# Patient Record
Sex: Female | Born: 1940 | Race: White | Hispanic: No | Marital: Married | Smoking: Never smoker
Health system: Southern US, Community
[De-identification: ages and names within clinical notes are randomized; demographics above are authoritative.]

## PROBLEM LIST (undated history)

## (undated) DIAGNOSIS — I10 Essential (primary) hypertension: Secondary | ICD-10-CM

## (undated) DIAGNOSIS — E785 Hyperlipidemia, unspecified: Secondary | ICD-10-CM

## (undated) DIAGNOSIS — E559 Vitamin D deficiency, unspecified: Secondary | ICD-10-CM

## (undated) DIAGNOSIS — E1140 Type 2 diabetes mellitus with diabetic neuropathy, unspecified: Secondary | ICD-10-CM

## (undated) DIAGNOSIS — M810 Age-related osteoporosis without current pathological fracture: Secondary | ICD-10-CM

## (undated) DIAGNOSIS — E119 Type 2 diabetes mellitus without complications: Secondary | ICD-10-CM

## (undated) DIAGNOSIS — E538 Deficiency of other specified B group vitamins: Secondary | ICD-10-CM

## (undated) HISTORY — PX: CATARACT EXTRACTION: SUR2

## (undated) HISTORY — DX: Type 2 diabetes mellitus without complications: E11.9

## (undated) HISTORY — PX: TONSILLECTOMY AND ADENOIDECTOMY: SHX28

## (undated) HISTORY — DX: Vitamin D deficiency, unspecified: E55.9

## (undated) HISTORY — DX: Deficiency of other specified B group vitamins: E53.8

## (undated) HISTORY — DX: Essential (primary) hypertension: I10

## (undated) HISTORY — DX: Hyperlipidemia, unspecified: E78.5

## (undated) HISTORY — PX: BLADDER SURGERY: SHX569

## (undated) HISTORY — DX: Type 2 diabetes mellitus with diabetic neuropathy, unspecified: E11.40

## (undated) HISTORY — DX: Age-related osteoporosis without current pathological fracture: M81.0

## (undated) HISTORY — PX: BREAST LUMPECTOMY: SHX2

## (undated) HISTORY — PX: FOOT SURGERY: SHX648

---

## 1999-04-11 ENCOUNTER — Ambulatory Visit (HOSPITAL_COMMUNITY): Admission: RE | Admit: 1999-04-11 | Discharge: 1999-04-11 | Payer: Self-pay | Admitting: Neurology

## 1999-04-11 ENCOUNTER — Encounter: Payer: Self-pay | Admitting: Neurology

## 2000-02-16 ENCOUNTER — Encounter: Admission: RE | Admit: 2000-02-16 | Discharge: 2000-02-16 | Payer: Self-pay | Admitting: Emergency Medicine

## 2000-02-16 ENCOUNTER — Encounter: Payer: Self-pay | Admitting: Emergency Medicine

## 2001-02-16 ENCOUNTER — Encounter: Payer: Self-pay | Admitting: Emergency Medicine

## 2001-02-16 ENCOUNTER — Encounter: Admission: RE | Admit: 2001-02-16 | Discharge: 2001-02-16 | Payer: Self-pay | Admitting: Emergency Medicine

## 2001-02-27 ENCOUNTER — Encounter: Payer: Self-pay | Admitting: Emergency Medicine

## 2001-02-27 ENCOUNTER — Encounter: Admission: RE | Admit: 2001-02-27 | Discharge: 2001-02-27 | Payer: Self-pay | Admitting: Emergency Medicine

## 2001-03-20 ENCOUNTER — Ambulatory Visit (HOSPITAL_COMMUNITY): Admission: RE | Admit: 2001-03-20 | Discharge: 2001-03-20 | Payer: Self-pay | Admitting: Gastroenterology

## 2002-03-06 ENCOUNTER — Encounter: Payer: Self-pay | Admitting: Emergency Medicine

## 2002-03-06 ENCOUNTER — Encounter: Admission: RE | Admit: 2002-03-06 | Discharge: 2002-03-06 | Payer: Self-pay | Admitting: Emergency Medicine

## 2003-03-12 ENCOUNTER — Encounter: Admission: RE | Admit: 2003-03-12 | Discharge: 2003-03-12 | Payer: Self-pay | Admitting: Emergency Medicine

## 2003-03-12 ENCOUNTER — Encounter: Payer: Self-pay | Admitting: Emergency Medicine

## 2004-01-28 ENCOUNTER — Other Ambulatory Visit: Admission: RE | Admit: 2004-01-28 | Discharge: 2004-01-28 | Payer: Self-pay | Admitting: Emergency Medicine

## 2004-03-31 ENCOUNTER — Encounter: Admission: RE | Admit: 2004-03-31 | Discharge: 2004-03-31 | Payer: Self-pay | Admitting: Emergency Medicine

## 2005-04-13 ENCOUNTER — Encounter: Admission: RE | Admit: 2005-04-13 | Discharge: 2005-04-13 | Payer: Self-pay | Admitting: Emergency Medicine

## 2006-04-12 ENCOUNTER — Other Ambulatory Visit: Admission: RE | Admit: 2006-04-12 | Discharge: 2006-04-12 | Payer: Self-pay | Admitting: Emergency Medicine

## 2006-04-18 ENCOUNTER — Encounter: Admission: RE | Admit: 2006-04-18 | Discharge: 2006-04-18 | Payer: Self-pay | Admitting: Emergency Medicine

## 2007-05-02 ENCOUNTER — Encounter: Admission: RE | Admit: 2007-05-02 | Discharge: 2007-05-02 | Payer: Self-pay | Admitting: Emergency Medicine

## 2008-05-06 ENCOUNTER — Encounter: Admission: RE | Admit: 2008-05-06 | Discharge: 2008-05-06 | Payer: Self-pay

## 2008-05-21 ENCOUNTER — Encounter: Admission: RE | Admit: 2008-05-21 | Discharge: 2008-05-21 | Payer: Self-pay

## 2009-05-27 ENCOUNTER — Encounter: Admission: RE | Admit: 2009-05-27 | Discharge: 2009-05-27 | Payer: Self-pay

## 2010-06-01 ENCOUNTER — Encounter: Admission: RE | Admit: 2010-06-01 | Discharge: 2010-06-01 | Payer: Self-pay | Admitting: Family Medicine

## 2010-08-04 ENCOUNTER — Encounter: Admission: RE | Admit: 2010-08-04 | Discharge: 2010-08-04 | Payer: Self-pay | Source: Home / Self Care

## 2010-11-26 NOTE — Procedures (Signed)
[place]. [facility]  Patient:    Jodi Nelson, Jodi Nelson Visit Number: [id number] MRN: [id number]          Service Type: END Location: ENDO Attending Physician:  Orland Mustard Dictated by:   Llana Aliment. Randa Evens, M.D. Proc. Date: 03/20/01 Admit Date:  03/20/2001   CC:         Oley Balm. Georgina Pillion, M.D.   Procedure Report  PROCEDURE:  Colonoscopy.  MEDICATIONS:  Fentanyl 80 mcg, Versed 9 mg IV.  SCOPE:  Pediatric Olympus video colonoscope.  INDICATION:  Family history of colon cancer.  DESCRIPTION OF PROCEDURE:  The procedure was explained to the patient and consent obtained.  With the patient in the left lateral decubitus position, the Olympus video colonoscope was inserted and advanced under direct visualization.  Prep was excellent.  We advanced to the cecum without difficulty.  The ileocecal valve and the appendiceal orifice were seen.  The scope was withdrawn, and the cecum, ascending colon, hepatic flexure, transverse colon, splenic flexure, descending, and sigmoid colon were seen well upon removal.  No polyps or other lesions were seen.  The scope was withdrawn.  The patient tolerated the procedure well, was maintained on low-flow oxygen and pulse oximeter throughout the procedure.  ASSESSMENT:  No colon polyps seen in this woman.  High risk for colon cancer.  PLAN:  Recommend repeating in five years.Dictated by:   Llana Aliment. Randa Evens, M.D.  Attending Physician:  Orland Mustard DD:  03/20/01 TD:  03/20/01 Job: [id number]

## 2011-05-30 ENCOUNTER — Other Ambulatory Visit: Payer: Self-pay | Admitting: Family Medicine

## 2011-05-30 DIAGNOSIS — Z1231 Encounter for screening mammogram for malignant neoplasm of breast: Secondary | ICD-10-CM

## 2011-07-19 ENCOUNTER — Ambulatory Visit
Admission: RE | Admit: 2011-07-19 | Discharge: 2011-07-19 | Disposition: A | Discharge status: Home / Self Care | Payer: MEDICARE | Source: Ambulatory Visit | Attending: Family Medicine | Admitting: Family Medicine

## 2011-07-19 DIAGNOSIS — Z1231 Encounter for screening mammogram for malignant neoplasm of breast: Secondary | ICD-10-CM

## 2011-07-26 ENCOUNTER — Other Ambulatory Visit: Payer: Self-pay | Admitting: Family Medicine

## 2011-07-26 DIAGNOSIS — R928 Other abnormal and inconclusive findings on diagnostic imaging of breast: Secondary | ICD-10-CM

## 2011-08-02 ENCOUNTER — Ambulatory Visit
Admission: RE | Admit: 2011-08-02 | Discharge: 2011-08-02 | Disposition: A | Discharge status: Home / Self Care | Payer: MEDICARE | Source: Ambulatory Visit | Attending: Family Medicine | Admitting: Family Medicine

## 2011-08-02 ENCOUNTER — Other Ambulatory Visit: Payer: Self-pay | Admitting: Family Medicine

## 2011-08-02 DIAGNOSIS — R928 Other abnormal and inconclusive findings on diagnostic imaging of breast: Secondary | ICD-10-CM

## 2011-08-10 ENCOUNTER — Ambulatory Visit
Admission: RE | Admit: 2011-08-10 | Discharge: 2011-08-10 | Disposition: A | Discharge status: Home / Self Care | Payer: Medicare Other | Source: Ambulatory Visit | Attending: Family Medicine | Admitting: Family Medicine

## 2011-08-10 ENCOUNTER — Other Ambulatory Visit: Payer: Self-pay | Admitting: Family Medicine

## 2011-08-10 DIAGNOSIS — R928 Other abnormal and inconclusive findings on diagnostic imaging of breast: Secondary | ICD-10-CM

## 2011-08-10 DIAGNOSIS — R921 Mammographic calcification found on diagnostic imaging of breast: Secondary | ICD-10-CM

## 2011-08-10 HISTORY — PX: BREAST BIOPSY: SHX20

## 2012-02-22 ENCOUNTER — Other Ambulatory Visit: Payer: Self-pay | Admitting: Family Medicine

## 2012-02-22 DIAGNOSIS — N6019 Diffuse cystic mastopathy of unspecified breast: Secondary | ICD-10-CM

## 2012-03-14 ENCOUNTER — Ambulatory Visit
Admission: RE | Admit: 2012-03-14 | Discharge: 2012-03-14 | Disposition: A | Discharge status: Home / Self Care | Payer: Medicare Other | Source: Ambulatory Visit | Attending: Family Medicine | Admitting: Family Medicine

## 2012-03-14 DIAGNOSIS — N6019 Diffuse cystic mastopathy of unspecified breast: Secondary | ICD-10-CM

## 2012-07-31 ENCOUNTER — Other Ambulatory Visit: Payer: Self-pay | Admitting: Family Medicine

## 2012-07-31 DIAGNOSIS — Z1231 Encounter for screening mammogram for malignant neoplasm of breast: Secondary | ICD-10-CM

## 2012-07-31 DIAGNOSIS — M810 Age-related osteoporosis without current pathological fracture: Secondary | ICD-10-CM

## 2012-08-14 ENCOUNTER — Ambulatory Visit: Payer: Medicare Other

## 2012-08-28 ENCOUNTER — Ambulatory Visit
Admission: RE | Admit: 2012-08-28 | Discharge: 2012-08-28 | Disposition: A | Discharge status: Home / Self Care | Payer: Medicare Other | Source: Ambulatory Visit | Attending: Family Medicine | Admitting: Family Medicine

## 2012-08-28 DIAGNOSIS — M810 Age-related osteoporosis without current pathological fracture: Secondary | ICD-10-CM

## 2012-08-28 DIAGNOSIS — Z1231 Encounter for screening mammogram for malignant neoplasm of breast: Secondary | ICD-10-CM

## 2013-08-19 ENCOUNTER — Other Ambulatory Visit: Payer: Self-pay

## 2013-08-19 DIAGNOSIS — Z1231 Encounter for screening mammogram for malignant neoplasm of breast: Secondary | ICD-10-CM

## 2013-09-10 ENCOUNTER — Ambulatory Visit
Admission: RE | Admit: 2013-09-10 | Discharge: 2013-09-10 | Disposition: A | Discharge status: Home / Self Care | Payer: Medicare Other | Source: Ambulatory Visit

## 2013-09-10 DIAGNOSIS — Z1231 Encounter for screening mammogram for malignant neoplasm of breast: Secondary | ICD-10-CM

## 2014-09-25 ENCOUNTER — Other Ambulatory Visit: Payer: Self-pay | Admitting: Internal Medicine

## 2014-09-25 DIAGNOSIS — Z1231 Encounter for screening mammogram for malignant neoplasm of breast: Secondary | ICD-10-CM

## 2014-09-25 DIAGNOSIS — M810 Age-related osteoporosis without current pathological fracture: Secondary | ICD-10-CM

## 2014-09-26 ENCOUNTER — Other Ambulatory Visit: Payer: Self-pay | Admitting: Family Medicine

## 2014-09-26 DIAGNOSIS — Z1231 Encounter for screening mammogram for malignant neoplasm of breast: Secondary | ICD-10-CM

## 2014-09-26 DIAGNOSIS — M810 Age-related osteoporosis without current pathological fracture: Secondary | ICD-10-CM

## 2014-10-08 ENCOUNTER — Ambulatory Visit
Admission: RE | Admit: 2014-10-08 | Discharge: 2014-10-08 | Disposition: A | Discharge status: Home / Self Care | Payer: Medicare Other | Source: Ambulatory Visit | Attending: Internal Medicine | Admitting: Internal Medicine

## 2014-10-08 DIAGNOSIS — Z1231 Encounter for screening mammogram for malignant neoplasm of breast: Secondary | ICD-10-CM

## 2014-10-08 DIAGNOSIS — M810 Age-related osteoporosis without current pathological fracture: Secondary | ICD-10-CM

## 2015-09-18 ENCOUNTER — Other Ambulatory Visit: Payer: Self-pay

## 2015-09-18 DIAGNOSIS — Z1231 Encounter for screening mammogram for malignant neoplasm of breast: Secondary | ICD-10-CM

## 2015-10-13 ENCOUNTER — Ambulatory Visit
Admission: RE | Admit: 2015-10-13 | Discharge: 2015-10-13 | Disposition: A | Discharge status: Home / Self Care | Payer: Medicare Other | Source: Ambulatory Visit

## 2015-10-13 DIAGNOSIS — Z1231 Encounter for screening mammogram for malignant neoplasm of breast: Secondary | ICD-10-CM

## 2016-07-19 DIAGNOSIS — H47233 Glaucomatous optic atrophy, bilateral: Secondary | ICD-10-CM | POA: Diagnosis not present

## 2016-07-19 DIAGNOSIS — H401132 Primary open-angle glaucoma, bilateral, moderate stage: Secondary | ICD-10-CM | POA: Diagnosis not present

## 2016-07-19 DIAGNOSIS — H3589 Other specified retinal disorders: Secondary | ICD-10-CM | POA: Diagnosis not present

## 2016-07-19 DIAGNOSIS — I10 Essential (primary) hypertension: Secondary | ICD-10-CM | POA: Diagnosis not present

## 2016-07-26 DIAGNOSIS — G603 Idiopathic progressive neuropathy: Secondary | ICD-10-CM | POA: Diagnosis not present

## 2016-07-26 DIAGNOSIS — R269 Unspecified abnormalities of gait and mobility: Secondary | ICD-10-CM | POA: Diagnosis not present

## 2016-07-26 DIAGNOSIS — E0842 Diabetes mellitus due to underlying condition with diabetic polyneuropathy: Secondary | ICD-10-CM | POA: Diagnosis not present

## 2016-07-26 DIAGNOSIS — M6281 Muscle weakness (generalized): Secondary | ICD-10-CM | POA: Diagnosis not present

## 2016-08-02 DIAGNOSIS — E538 Deficiency of other specified B group vitamins: Secondary | ICD-10-CM | POA: Diagnosis not present

## 2016-08-02 DIAGNOSIS — N390 Urinary tract infection, site not specified: Secondary | ICD-10-CM | POA: Diagnosis not present

## 2016-08-02 DIAGNOSIS — E0842 Diabetes mellitus due to underlying condition with diabetic polyneuropathy: Secondary | ICD-10-CM | POA: Diagnosis not present

## 2016-08-02 DIAGNOSIS — G603 Idiopathic progressive neuropathy: Secondary | ICD-10-CM | POA: Diagnosis not present

## 2016-08-02 DIAGNOSIS — M6281 Muscle weakness (generalized): Secondary | ICD-10-CM | POA: Diagnosis not present

## 2016-08-02 DIAGNOSIS — R269 Unspecified abnormalities of gait and mobility: Secondary | ICD-10-CM | POA: Diagnosis not present

## 2016-08-03 DIAGNOSIS — M6281 Muscle weakness (generalized): Secondary | ICD-10-CM | POA: Diagnosis not present

## 2016-08-03 DIAGNOSIS — E0842 Diabetes mellitus due to underlying condition with diabetic polyneuropathy: Secondary | ICD-10-CM | POA: Diagnosis not present

## 2016-08-03 DIAGNOSIS — R269 Unspecified abnormalities of gait and mobility: Secondary | ICD-10-CM | POA: Diagnosis not present

## 2016-08-03 DIAGNOSIS — G603 Idiopathic progressive neuropathy: Secondary | ICD-10-CM | POA: Diagnosis not present

## 2016-08-09 DIAGNOSIS — R269 Unspecified abnormalities of gait and mobility: Secondary | ICD-10-CM | POA: Diagnosis not present

## 2016-08-09 DIAGNOSIS — G603 Idiopathic progressive neuropathy: Secondary | ICD-10-CM | POA: Diagnosis not present

## 2016-08-09 DIAGNOSIS — E0842 Diabetes mellitus due to underlying condition with diabetic polyneuropathy: Secondary | ICD-10-CM | POA: Diagnosis not present

## 2016-08-09 DIAGNOSIS — M6281 Muscle weakness (generalized): Secondary | ICD-10-CM | POA: Diagnosis not present

## 2016-08-10 DIAGNOSIS — R269 Unspecified abnormalities of gait and mobility: Secondary | ICD-10-CM | POA: Diagnosis not present

## 2016-08-10 DIAGNOSIS — M6281 Muscle weakness (generalized): Secondary | ICD-10-CM | POA: Diagnosis not present

## 2016-08-10 DIAGNOSIS — E0842 Diabetes mellitus due to underlying condition with diabetic polyneuropathy: Secondary | ICD-10-CM | POA: Diagnosis not present

## 2016-08-10 DIAGNOSIS — G603 Idiopathic progressive neuropathy: Secondary | ICD-10-CM | POA: Diagnosis not present

## 2016-08-16 DIAGNOSIS — G603 Idiopathic progressive neuropathy: Secondary | ICD-10-CM | POA: Diagnosis not present

## 2016-08-16 DIAGNOSIS — N904 Leukoplakia of vulva: Secondary | ICD-10-CM | POA: Diagnosis not present

## 2016-08-16 DIAGNOSIS — E0842 Diabetes mellitus due to underlying condition with diabetic polyneuropathy: Secondary | ICD-10-CM | POA: Diagnosis not present

## 2016-08-16 DIAGNOSIS — R269 Unspecified abnormalities of gait and mobility: Secondary | ICD-10-CM | POA: Diagnosis not present

## 2016-08-16 DIAGNOSIS — M6281 Muscle weakness (generalized): Secondary | ICD-10-CM | POA: Diagnosis not present

## 2016-08-17 DIAGNOSIS — G603 Idiopathic progressive neuropathy: Secondary | ICD-10-CM | POA: Diagnosis not present

## 2016-08-17 DIAGNOSIS — M6281 Muscle weakness (generalized): Secondary | ICD-10-CM | POA: Diagnosis not present

## 2016-08-17 DIAGNOSIS — R269 Unspecified abnormalities of gait and mobility: Secondary | ICD-10-CM | POA: Diagnosis not present

## 2016-08-17 DIAGNOSIS — E0842 Diabetes mellitus due to underlying condition with diabetic polyneuropathy: Secondary | ICD-10-CM | POA: Diagnosis not present

## 2016-08-23 DIAGNOSIS — E0842 Diabetes mellitus due to underlying condition with diabetic polyneuropathy: Secondary | ICD-10-CM | POA: Diagnosis not present

## 2016-08-23 DIAGNOSIS — G603 Idiopathic progressive neuropathy: Secondary | ICD-10-CM | POA: Diagnosis not present

## 2016-08-23 DIAGNOSIS — R269 Unspecified abnormalities of gait and mobility: Secondary | ICD-10-CM | POA: Diagnosis not present

## 2016-08-23 DIAGNOSIS — M6281 Muscle weakness (generalized): Secondary | ICD-10-CM | POA: Diagnosis not present

## 2016-08-24 DIAGNOSIS — G603 Idiopathic progressive neuropathy: Secondary | ICD-10-CM | POA: Diagnosis not present

## 2016-08-24 DIAGNOSIS — R269 Unspecified abnormalities of gait and mobility: Secondary | ICD-10-CM | POA: Diagnosis not present

## 2016-08-24 DIAGNOSIS — E0842 Diabetes mellitus due to underlying condition with diabetic polyneuropathy: Secondary | ICD-10-CM | POA: Diagnosis not present

## 2016-08-24 DIAGNOSIS — M6281 Muscle weakness (generalized): Secondary | ICD-10-CM | POA: Diagnosis not present

## 2016-08-31 DIAGNOSIS — R269 Unspecified abnormalities of gait and mobility: Secondary | ICD-10-CM | POA: Diagnosis not present

## 2016-08-31 DIAGNOSIS — G603 Idiopathic progressive neuropathy: Secondary | ICD-10-CM | POA: Diagnosis not present

## 2016-08-31 DIAGNOSIS — E0842 Diabetes mellitus due to underlying condition with diabetic polyneuropathy: Secondary | ICD-10-CM | POA: Diagnosis not present

## 2016-08-31 DIAGNOSIS — M6281 Muscle weakness (generalized): Secondary | ICD-10-CM | POA: Diagnosis not present

## 2016-08-31 DIAGNOSIS — E538 Deficiency of other specified B group vitamins: Secondary | ICD-10-CM | POA: Diagnosis not present

## 2016-09-06 DIAGNOSIS — E0842 Diabetes mellitus due to underlying condition with diabetic polyneuropathy: Secondary | ICD-10-CM | POA: Diagnosis not present

## 2016-09-06 DIAGNOSIS — G603 Idiopathic progressive neuropathy: Secondary | ICD-10-CM | POA: Diagnosis not present

## 2016-09-06 DIAGNOSIS — R269 Unspecified abnormalities of gait and mobility: Secondary | ICD-10-CM | POA: Diagnosis not present

## 2016-09-06 DIAGNOSIS — M6281 Muscle weakness (generalized): Secondary | ICD-10-CM | POA: Diagnosis not present

## 2016-09-16 ENCOUNTER — Other Ambulatory Visit: Payer: Self-pay | Admitting: Family Medicine

## 2016-09-16 DIAGNOSIS — Z1231 Encounter for screening mammogram for malignant neoplasm of breast: Secondary | ICD-10-CM

## 2016-10-04 DIAGNOSIS — E538 Deficiency of other specified B group vitamins: Secondary | ICD-10-CM | POA: Diagnosis not present

## 2016-10-18 DIAGNOSIS — I10 Essential (primary) hypertension: Secondary | ICD-10-CM | POA: Diagnosis not present

## 2016-10-18 DIAGNOSIS — H401132 Primary open-angle glaucoma, bilateral, moderate stage: Secondary | ICD-10-CM | POA: Diagnosis not present

## 2016-10-18 DIAGNOSIS — H47233 Glaucomatous optic atrophy, bilateral: Secondary | ICD-10-CM | POA: Diagnosis not present

## 2016-10-25 ENCOUNTER — Ambulatory Visit
Admission: RE | Admit: 2016-10-25 | Discharge: 2016-10-25 | Disposition: A | Discharge status: Home / Self Care | Payer: PPO | Source: Ambulatory Visit | Attending: Family Medicine | Admitting: Family Medicine

## 2016-10-25 DIAGNOSIS — Z1231 Encounter for screening mammogram for malignant neoplasm of breast: Secondary | ICD-10-CM | POA: Diagnosis not present

## 2016-11-01 DIAGNOSIS — E538 Deficiency of other specified B group vitamins: Secondary | ICD-10-CM | POA: Diagnosis not present

## 2016-11-01 DIAGNOSIS — M810 Age-related osteoporosis without current pathological fracture: Secondary | ICD-10-CM | POA: Diagnosis not present

## 2016-11-01 DIAGNOSIS — E559 Vitamin D deficiency, unspecified: Secondary | ICD-10-CM | POA: Diagnosis not present

## 2016-11-01 DIAGNOSIS — E782 Mixed hyperlipidemia: Secondary | ICD-10-CM | POA: Diagnosis not present

## 2016-11-01 DIAGNOSIS — E1136 Type 2 diabetes mellitus with diabetic cataract: Secondary | ICD-10-CM | POA: Diagnosis not present

## 2016-11-01 DIAGNOSIS — I10 Essential (primary) hypertension: Secondary | ICD-10-CM | POA: Diagnosis not present

## 2016-11-01 DIAGNOSIS — Z79899 Other long term (current) drug therapy: Secondary | ICD-10-CM | POA: Diagnosis not present

## 2016-11-01 DIAGNOSIS — E1143 Type 2 diabetes mellitus with diabetic autonomic (poly)neuropathy: Secondary | ICD-10-CM | POA: Diagnosis not present

## 2016-12-08 DIAGNOSIS — Z0000 Encounter for general adult medical examination without abnormal findings: Secondary | ICD-10-CM | POA: Diagnosis not present

## 2016-12-08 DIAGNOSIS — E538 Deficiency of other specified B group vitamins: Secondary | ICD-10-CM | POA: Diagnosis not present

## 2016-12-08 DIAGNOSIS — E785 Hyperlipidemia, unspecified: Secondary | ICD-10-CM | POA: Diagnosis not present

## 2016-12-20 ENCOUNTER — Other Ambulatory Visit: Payer: Self-pay | Admitting: Family Medicine

## 2016-12-20 DIAGNOSIS — M859 Disorder of bone density and structure, unspecified: Secondary | ICD-10-CM

## 2017-01-02 ENCOUNTER — Other Ambulatory Visit: Payer: Self-pay | Admitting: Family Medicine

## 2017-01-02 DIAGNOSIS — M810 Age-related osteoporosis without current pathological fracture: Secondary | ICD-10-CM

## 2017-01-04 ENCOUNTER — Ambulatory Visit
Admission: RE | Admit: 2017-01-04 | Discharge: 2017-01-04 | Disposition: A | Discharge status: Home / Self Care | Payer: PPO | Source: Ambulatory Visit | Attending: Family Medicine | Admitting: Family Medicine

## 2017-01-04 DIAGNOSIS — M810 Age-related osteoporosis without current pathological fracture: Secondary | ICD-10-CM

## 2017-01-04 DIAGNOSIS — Z780 Asymptomatic menopausal state: Secondary | ICD-10-CM | POA: Diagnosis not present

## 2017-01-04 DIAGNOSIS — M85852 Other specified disorders of bone density and structure, left thigh: Secondary | ICD-10-CM | POA: Diagnosis not present

## 2017-01-10 DIAGNOSIS — E538 Deficiency of other specified B group vitamins: Secondary | ICD-10-CM | POA: Diagnosis not present

## 2017-01-16 DIAGNOSIS — J0100 Acute maxillary sinusitis, unspecified: Secondary | ICD-10-CM | POA: Diagnosis not present

## 2017-01-24 DIAGNOSIS — I10 Essential (primary) hypertension: Secondary | ICD-10-CM | POA: Diagnosis not present

## 2017-01-24 DIAGNOSIS — H401132 Primary open-angle glaucoma, bilateral, moderate stage: Secondary | ICD-10-CM | POA: Diagnosis not present

## 2017-01-24 DIAGNOSIS — H47233 Glaucomatous optic atrophy, bilateral: Secondary | ICD-10-CM | POA: Diagnosis not present

## 2017-02-14 DIAGNOSIS — E538 Deficiency of other specified B group vitamins: Secondary | ICD-10-CM | POA: Diagnosis not present

## 2017-02-22 DIAGNOSIS — N904 Leukoplakia of vulva: Secondary | ICD-10-CM | POA: Diagnosis not present

## 2017-03-14 DIAGNOSIS — E538 Deficiency of other specified B group vitamins: Secondary | ICD-10-CM | POA: Diagnosis not present

## 2017-03-14 DIAGNOSIS — E1143 Type 2 diabetes mellitus with diabetic autonomic (poly)neuropathy: Secondary | ICD-10-CM | POA: Diagnosis not present

## 2017-03-14 DIAGNOSIS — E785 Hyperlipidemia, unspecified: Secondary | ICD-10-CM | POA: Diagnosis not present

## 2017-03-14 DIAGNOSIS — M810 Age-related osteoporosis without current pathological fracture: Secondary | ICD-10-CM | POA: Diagnosis not present

## 2017-03-14 DIAGNOSIS — E559 Vitamin D deficiency, unspecified: Secondary | ICD-10-CM | POA: Diagnosis not present

## 2017-03-14 DIAGNOSIS — I10 Essential (primary) hypertension: Secondary | ICD-10-CM | POA: Diagnosis not present

## 2017-03-22 DIAGNOSIS — E538 Deficiency of other specified B group vitamins: Secondary | ICD-10-CM | POA: Diagnosis not present

## 2017-04-11 DIAGNOSIS — H5200 Hypermetropia, unspecified eye: Secondary | ICD-10-CM | POA: Diagnosis not present

## 2017-04-11 DIAGNOSIS — E119 Type 2 diabetes mellitus without complications: Secondary | ICD-10-CM | POA: Diagnosis not present

## 2017-04-11 DIAGNOSIS — H52229 Regular astigmatism, unspecified eye: Secondary | ICD-10-CM | POA: Diagnosis not present

## 2017-04-11 DIAGNOSIS — H35372 Puckering of macula, left eye: Secondary | ICD-10-CM | POA: Diagnosis not present

## 2017-04-11 DIAGNOSIS — I10 Essential (primary) hypertension: Secondary | ICD-10-CM | POA: Diagnosis not present

## 2017-04-11 DIAGNOSIS — H47233 Glaucomatous optic atrophy, bilateral: Secondary | ICD-10-CM | POA: Diagnosis not present

## 2017-04-11 DIAGNOSIS — H524 Presbyopia: Secondary | ICD-10-CM | POA: Diagnosis not present

## 2017-04-11 DIAGNOSIS — H43313 Vitreous membranes and strands, bilateral: Secondary | ICD-10-CM | POA: Diagnosis not present

## 2017-04-11 DIAGNOSIS — H401132 Primary open-angle glaucoma, bilateral, moderate stage: Secondary | ICD-10-CM | POA: Diagnosis not present

## 2017-04-11 DIAGNOSIS — H43813 Vitreous degeneration, bilateral: Secondary | ICD-10-CM | POA: Diagnosis not present

## 2017-04-11 DIAGNOSIS — H25813 Combined forms of age-related cataract, bilateral: Secondary | ICD-10-CM | POA: Diagnosis not present

## 2017-04-19 DIAGNOSIS — E538 Deficiency of other specified B group vitamins: Secondary | ICD-10-CM | POA: Diagnosis not present

## 2017-04-19 DIAGNOSIS — Z23 Encounter for immunization: Secondary | ICD-10-CM | POA: Diagnosis not present

## 2017-05-23 DIAGNOSIS — E538 Deficiency of other specified B group vitamins: Secondary | ICD-10-CM | POA: Diagnosis not present

## 2017-06-21 DIAGNOSIS — E538 Deficiency of other specified B group vitamins: Secondary | ICD-10-CM | POA: Diagnosis not present

## 2017-07-12 DIAGNOSIS — E1143 Type 2 diabetes mellitus with diabetic autonomic (poly)neuropathy: Secondary | ICD-10-CM | POA: Diagnosis not present

## 2017-07-12 DIAGNOSIS — E785 Hyperlipidemia, unspecified: Secondary | ICD-10-CM | POA: Diagnosis not present

## 2017-07-12 DIAGNOSIS — I10 Essential (primary) hypertension: Secondary | ICD-10-CM | POA: Diagnosis not present

## 2017-07-12 DIAGNOSIS — Z1331 Encounter for screening for depression: Secondary | ICD-10-CM | POA: Diagnosis not present

## 2017-07-12 DIAGNOSIS — E538 Deficiency of other specified B group vitamins: Secondary | ICD-10-CM | POA: Diagnosis not present

## 2017-07-12 DIAGNOSIS — Z9181 History of falling: Secondary | ICD-10-CM | POA: Diagnosis not present

## 2017-07-12 DIAGNOSIS — E663 Overweight: Secondary | ICD-10-CM | POA: Diagnosis not present

## 2017-07-12 DIAGNOSIS — Z0000 Encounter for general adult medical examination without abnormal findings: Secondary | ICD-10-CM | POA: Diagnosis not present

## 2017-07-12 DIAGNOSIS — Z1211 Encounter for screening for malignant neoplasm of colon: Secondary | ICD-10-CM | POA: Diagnosis not present

## 2017-07-12 DIAGNOSIS — E559 Vitamin D deficiency, unspecified: Secondary | ICD-10-CM | POA: Diagnosis not present

## 2017-07-19 DIAGNOSIS — I10 Essential (primary) hypertension: Secondary | ICD-10-CM | POA: Diagnosis not present

## 2017-07-19 DIAGNOSIS — H47233 Glaucomatous optic atrophy, bilateral: Secondary | ICD-10-CM | POA: Diagnosis not present

## 2017-07-19 DIAGNOSIS — H401132 Primary open-angle glaucoma, bilateral, moderate stage: Secondary | ICD-10-CM | POA: Diagnosis not present

## 2017-07-26 DIAGNOSIS — M810 Age-related osteoporosis without current pathological fracture: Secondary | ICD-10-CM | POA: Diagnosis not present

## 2017-07-26 DIAGNOSIS — E559 Vitamin D deficiency, unspecified: Secondary | ICD-10-CM | POA: Diagnosis not present

## 2017-07-26 DIAGNOSIS — E785 Hyperlipidemia, unspecified: Secondary | ICD-10-CM | POA: Diagnosis not present

## 2017-07-26 DIAGNOSIS — E538 Deficiency of other specified B group vitamins: Secondary | ICD-10-CM | POA: Diagnosis not present

## 2017-07-26 DIAGNOSIS — R8290 Unspecified abnormal findings in urine: Secondary | ICD-10-CM | POA: Diagnosis not present

## 2017-07-26 DIAGNOSIS — E1143 Type 2 diabetes mellitus with diabetic autonomic (poly)neuropathy: Secondary | ICD-10-CM | POA: Diagnosis not present

## 2017-07-26 DIAGNOSIS — I10 Essential (primary) hypertension: Secondary | ICD-10-CM | POA: Diagnosis not present

## 2017-07-28 DIAGNOSIS — R69 Illness, unspecified: Secondary | ICD-10-CM | POA: Diagnosis not present

## 2017-08-25 DIAGNOSIS — R69 Illness, unspecified: Secondary | ICD-10-CM | POA: Diagnosis not present

## 2017-08-29 DIAGNOSIS — E538 Deficiency of other specified B group vitamins: Secondary | ICD-10-CM | POA: Diagnosis not present

## 2017-09-26 DIAGNOSIS — E538 Deficiency of other specified B group vitamins: Secondary | ICD-10-CM | POA: Diagnosis not present

## 2017-09-28 ENCOUNTER — Other Ambulatory Visit: Payer: Self-pay | Admitting: Family Medicine

## 2017-09-28 DIAGNOSIS — Z1231 Encounter for screening mammogram for malignant neoplasm of breast: Secondary | ICD-10-CM

## 2017-10-31 ENCOUNTER — Ambulatory Visit
Admission: RE | Admit: 2017-10-31 | Discharge: 2017-10-31 | Disposition: A | Discharge status: Home / Self Care | Payer: PPO | Source: Ambulatory Visit | Attending: Family Medicine | Admitting: Family Medicine

## 2017-10-31 DIAGNOSIS — Z1231 Encounter for screening mammogram for malignant neoplasm of breast: Secondary | ICD-10-CM | POA: Diagnosis not present

## 2017-11-07 DIAGNOSIS — H401132 Primary open-angle glaucoma, bilateral, moderate stage: Secondary | ICD-10-CM | POA: Diagnosis not present

## 2017-11-07 DIAGNOSIS — H47233 Glaucomatous optic atrophy, bilateral: Secondary | ICD-10-CM | POA: Diagnosis not present

## 2017-11-08 DIAGNOSIS — M810 Age-related osteoporosis without current pathological fracture: Secondary | ICD-10-CM | POA: Diagnosis not present

## 2017-11-08 DIAGNOSIS — J329 Chronic sinusitis, unspecified: Secondary | ICD-10-CM | POA: Diagnosis not present

## 2017-11-08 DIAGNOSIS — E559 Vitamin D deficiency, unspecified: Secondary | ICD-10-CM | POA: Diagnosis not present

## 2017-11-08 DIAGNOSIS — E538 Deficiency of other specified B group vitamins: Secondary | ICD-10-CM | POA: Diagnosis not present

## 2017-11-08 DIAGNOSIS — E785 Hyperlipidemia, unspecified: Secondary | ICD-10-CM | POA: Diagnosis not present

## 2017-11-08 DIAGNOSIS — E1143 Type 2 diabetes mellitus with diabetic autonomic (poly)neuropathy: Secondary | ICD-10-CM | POA: Diagnosis not present

## 2017-11-13 DIAGNOSIS — R69 Illness, unspecified: Secondary | ICD-10-CM | POA: Diagnosis not present

## 2017-12-12 DIAGNOSIS — E538 Deficiency of other specified B group vitamins: Secondary | ICD-10-CM | POA: Diagnosis not present

## 2017-12-27 DIAGNOSIS — G629 Polyneuropathy, unspecified: Secondary | ICD-10-CM | POA: Diagnosis not present

## 2018-01-03 DIAGNOSIS — E785 Hyperlipidemia, unspecified: Secondary | ICD-10-CM | POA: Diagnosis not present

## 2018-01-03 DIAGNOSIS — Z1331 Encounter for screening for depression: Secondary | ICD-10-CM | POA: Diagnosis not present

## 2018-01-03 DIAGNOSIS — Z9181 History of falling: Secondary | ICD-10-CM | POA: Diagnosis not present

## 2018-01-03 DIAGNOSIS — Z1339 Encounter for screening examination for other mental health and behavioral disorders: Secondary | ICD-10-CM | POA: Diagnosis not present

## 2018-01-03 DIAGNOSIS — Z0000 Encounter for general adult medical examination without abnormal findings: Secondary | ICD-10-CM | POA: Diagnosis not present

## 2018-01-03 DIAGNOSIS — Z23 Encounter for immunization: Secondary | ICD-10-CM | POA: Diagnosis not present

## 2018-01-16 DIAGNOSIS — R69 Illness, unspecified: Secondary | ICD-10-CM | POA: Diagnosis not present

## 2018-01-16 DIAGNOSIS — E538 Deficiency of other specified B group vitamins: Secondary | ICD-10-CM | POA: Diagnosis not present

## 2018-02-19 DIAGNOSIS — R69 Illness, unspecified: Secondary | ICD-10-CM | POA: Diagnosis not present

## 2018-02-20 DIAGNOSIS — H47233 Glaucomatous optic atrophy, bilateral: Secondary | ICD-10-CM | POA: Diagnosis not present

## 2018-02-20 DIAGNOSIS — H401132 Primary open-angle glaucoma, bilateral, moderate stage: Secondary | ICD-10-CM | POA: Diagnosis not present

## 2018-02-27 DIAGNOSIS — M810 Age-related osteoporosis without current pathological fracture: Secondary | ICD-10-CM | POA: Diagnosis not present

## 2018-02-27 DIAGNOSIS — E538 Deficiency of other specified B group vitamins: Secondary | ICD-10-CM | POA: Diagnosis not present

## 2018-02-27 DIAGNOSIS — I10 Essential (primary) hypertension: Secondary | ICD-10-CM | POA: Diagnosis not present

## 2018-02-27 DIAGNOSIS — E1143 Type 2 diabetes mellitus with diabetic autonomic (poly)neuropathy: Secondary | ICD-10-CM | POA: Diagnosis not present

## 2018-02-27 DIAGNOSIS — E559 Vitamin D deficiency, unspecified: Secondary | ICD-10-CM | POA: Diagnosis not present

## 2018-02-27 DIAGNOSIS — E785 Hyperlipidemia, unspecified: Secondary | ICD-10-CM | POA: Diagnosis not present

## 2018-04-03 DIAGNOSIS — E538 Deficiency of other specified B group vitamins: Secondary | ICD-10-CM | POA: Diagnosis not present

## 2018-04-18 DIAGNOSIS — J209 Acute bronchitis, unspecified: Secondary | ICD-10-CM | POA: Diagnosis not present

## 2018-04-23 DIAGNOSIS — R69 Illness, unspecified: Secondary | ICD-10-CM | POA: Diagnosis not present

## 2018-05-08 DIAGNOSIS — Z23 Encounter for immunization: Secondary | ICD-10-CM | POA: Diagnosis not present

## 2018-05-08 DIAGNOSIS — E538 Deficiency of other specified B group vitamins: Secondary | ICD-10-CM | POA: Diagnosis not present

## 2018-05-28 DIAGNOSIS — R69 Illness, unspecified: Secondary | ICD-10-CM | POA: Diagnosis not present

## 2018-05-29 DIAGNOSIS — H5203 Hypermetropia, bilateral: Secondary | ICD-10-CM | POA: Diagnosis not present

## 2018-05-29 DIAGNOSIS — R69 Illness, unspecified: Secondary | ICD-10-CM | POA: Diagnosis not present

## 2018-05-29 DIAGNOSIS — H353121 Nonexudative age-related macular degeneration, left eye, early dry stage: Secondary | ICD-10-CM | POA: Diagnosis not present

## 2018-05-29 DIAGNOSIS — H52223 Regular astigmatism, bilateral: Secondary | ICD-10-CM | POA: Diagnosis not present

## 2018-05-29 DIAGNOSIS — H35372 Puckering of macula, left eye: Secondary | ICD-10-CM | POA: Diagnosis not present

## 2018-05-29 DIAGNOSIS — H401132 Primary open-angle glaucoma, bilateral, moderate stage: Secondary | ICD-10-CM | POA: Diagnosis not present

## 2018-05-29 DIAGNOSIS — E119 Type 2 diabetes mellitus without complications: Secondary | ICD-10-CM | POA: Diagnosis not present

## 2018-05-29 DIAGNOSIS — H25813 Combined forms of age-related cataract, bilateral: Secondary | ICD-10-CM | POA: Diagnosis not present

## 2018-05-29 DIAGNOSIS — Z7984 Long term (current) use of oral hypoglycemic drugs: Secondary | ICD-10-CM | POA: Diagnosis not present

## 2018-05-29 DIAGNOSIS — H47233 Glaucomatous optic atrophy, bilateral: Secondary | ICD-10-CM | POA: Diagnosis not present

## 2018-05-29 DIAGNOSIS — H524 Presbyopia: Secondary | ICD-10-CM | POA: Diagnosis not present

## 2018-06-12 DIAGNOSIS — E538 Deficiency of other specified B group vitamins: Secondary | ICD-10-CM | POA: Diagnosis not present

## 2018-07-01 DIAGNOSIS — J0100 Acute maxillary sinusitis, unspecified: Secondary | ICD-10-CM | POA: Diagnosis not present

## 2018-07-14 DIAGNOSIS — J9801 Acute bronchospasm: Secondary | ICD-10-CM | POA: Diagnosis not present

## 2018-07-14 DIAGNOSIS — J069 Acute upper respiratory infection, unspecified: Secondary | ICD-10-CM | POA: Diagnosis not present

## 2018-07-17 DIAGNOSIS — F331 Major depressive disorder, recurrent, moderate: Secondary | ICD-10-CM | POA: Diagnosis not present

## 2018-07-17 DIAGNOSIS — R69 Illness, unspecified: Secondary | ICD-10-CM | POA: Diagnosis not present

## 2018-07-18 DIAGNOSIS — E538 Deficiency of other specified B group vitamins: Secondary | ICD-10-CM | POA: Diagnosis not present

## 2018-07-27 DIAGNOSIS — R69 Illness, unspecified: Secondary | ICD-10-CM | POA: Diagnosis not present

## 2018-08-21 DIAGNOSIS — E538 Deficiency of other specified B group vitamins: Secondary | ICD-10-CM | POA: Diagnosis not present

## 2018-08-27 DIAGNOSIS — R69 Illness, unspecified: Secondary | ICD-10-CM | POA: Diagnosis not present

## 2018-08-29 DIAGNOSIS — H47233 Glaucomatous optic atrophy, bilateral: Secondary | ICD-10-CM | POA: Diagnosis not present

## 2018-08-29 DIAGNOSIS — H401132 Primary open-angle glaucoma, bilateral, moderate stage: Secondary | ICD-10-CM | POA: Diagnosis not present

## 2018-09-04 DIAGNOSIS — M810 Age-related osteoporosis without current pathological fracture: Secondary | ICD-10-CM | POA: Diagnosis not present

## 2018-09-04 DIAGNOSIS — I10 Essential (primary) hypertension: Secondary | ICD-10-CM | POA: Diagnosis not present

## 2018-09-04 DIAGNOSIS — E785 Hyperlipidemia, unspecified: Secondary | ICD-10-CM | POA: Diagnosis not present

## 2018-09-04 DIAGNOSIS — E559 Vitamin D deficiency, unspecified: Secondary | ICD-10-CM | POA: Diagnosis not present

## 2018-09-04 DIAGNOSIS — E1143 Type 2 diabetes mellitus with diabetic autonomic (poly)neuropathy: Secondary | ICD-10-CM | POA: Diagnosis not present

## 2018-09-04 DIAGNOSIS — E538 Deficiency of other specified B group vitamins: Secondary | ICD-10-CM | POA: Diagnosis not present

## 2018-09-04 DIAGNOSIS — Z6827 Body mass index (BMI) 27.0-27.9, adult: Secondary | ICD-10-CM | POA: Diagnosis not present

## 2018-09-19 DIAGNOSIS — E538 Deficiency of other specified B group vitamins: Secondary | ICD-10-CM | POA: Diagnosis not present

## 2018-10-23 DIAGNOSIS — E538 Deficiency of other specified B group vitamins: Secondary | ICD-10-CM | POA: Diagnosis not present

## 2018-10-29 DIAGNOSIS — R69 Illness, unspecified: Secondary | ICD-10-CM | POA: Diagnosis not present

## 2018-11-21 DIAGNOSIS — E538 Deficiency of other specified B group vitamins: Secondary | ICD-10-CM | POA: Diagnosis not present

## 2018-11-28 ENCOUNTER — Other Ambulatory Visit: Payer: Self-pay | Admitting: Family Medicine

## 2018-11-28 ENCOUNTER — Other Ambulatory Visit: Payer: Self-pay | Admitting: Internal Medicine

## 2018-11-28 DIAGNOSIS — Z1231 Encounter for screening mammogram for malignant neoplasm of breast: Secondary | ICD-10-CM

## 2018-12-12 DIAGNOSIS — R69 Illness, unspecified: Secondary | ICD-10-CM | POA: Diagnosis not present

## 2018-12-26 DIAGNOSIS — E538 Deficiency of other specified B group vitamins: Secondary | ICD-10-CM | POA: Diagnosis not present

## 2019-01-08 DIAGNOSIS — E1143 Type 2 diabetes mellitus with diabetic autonomic (poly)neuropathy: Secondary | ICD-10-CM | POA: Diagnosis not present

## 2019-01-08 DIAGNOSIS — E559 Vitamin D deficiency, unspecified: Secondary | ICD-10-CM | POA: Diagnosis not present

## 2019-01-08 DIAGNOSIS — Z136 Encounter for screening for cardiovascular disorders: Secondary | ICD-10-CM | POA: Diagnosis not present

## 2019-01-08 DIAGNOSIS — Z139 Encounter for screening, unspecified: Secondary | ICD-10-CM | POA: Diagnosis not present

## 2019-01-08 DIAGNOSIS — Z1331 Encounter for screening for depression: Secondary | ICD-10-CM | POA: Diagnosis not present

## 2019-01-08 DIAGNOSIS — E538 Deficiency of other specified B group vitamins: Secondary | ICD-10-CM | POA: Diagnosis not present

## 2019-01-08 DIAGNOSIS — Z0000 Encounter for general adult medical examination without abnormal findings: Secondary | ICD-10-CM | POA: Diagnosis not present

## 2019-01-08 DIAGNOSIS — M810 Age-related osteoporosis without current pathological fracture: Secondary | ICD-10-CM | POA: Diagnosis not present

## 2019-01-08 DIAGNOSIS — E785 Hyperlipidemia, unspecified: Secondary | ICD-10-CM | POA: Diagnosis not present

## 2019-01-08 DIAGNOSIS — Z9181 History of falling: Secondary | ICD-10-CM | POA: Diagnosis not present

## 2019-01-09 ENCOUNTER — Other Ambulatory Visit: Payer: Self-pay | Admitting: Family Medicine

## 2019-01-09 DIAGNOSIS — M810 Age-related osteoporosis without current pathological fracture: Secondary | ICD-10-CM

## 2019-01-14 DIAGNOSIS — Z20828 Contact with and (suspected) exposure to other viral communicable diseases: Secondary | ICD-10-CM | POA: Diagnosis not present

## 2019-01-16 ENCOUNTER — Ambulatory Visit: Payer: Medicare HMO

## 2019-01-29 DIAGNOSIS — E538 Deficiency of other specified B group vitamins: Secondary | ICD-10-CM | POA: Diagnosis not present

## 2019-01-30 DIAGNOSIS — G629 Polyneuropathy, unspecified: Secondary | ICD-10-CM | POA: Diagnosis not present

## 2019-02-14 DIAGNOSIS — R69 Illness, unspecified: Secondary | ICD-10-CM | POA: Diagnosis not present

## 2019-02-26 ENCOUNTER — Ambulatory Visit
Admission: RE | Admit: 2019-02-26 | Discharge: 2019-02-26 | Disposition: A | Discharge status: Home / Self Care | Payer: Medicare HMO | Source: Ambulatory Visit | Attending: Internal Medicine | Admitting: Internal Medicine

## 2019-02-26 DIAGNOSIS — Z1231 Encounter for screening mammogram for malignant neoplasm of breast: Secondary | ICD-10-CM | POA: Diagnosis not present

## 2019-02-27 ENCOUNTER — Other Ambulatory Visit: Payer: Self-pay | Admitting: Internal Medicine

## 2019-02-27 DIAGNOSIS — R928 Other abnormal and inconclusive findings on diagnostic imaging of breast: Secondary | ICD-10-CM

## 2019-02-27 DIAGNOSIS — E538 Deficiency of other specified B group vitamins: Secondary | ICD-10-CM | POA: Diagnosis not present

## 2019-03-05 DIAGNOSIS — H47233 Glaucomatous optic atrophy, bilateral: Secondary | ICD-10-CM | POA: Diagnosis not present

## 2019-03-05 DIAGNOSIS — H401132 Primary open-angle glaucoma, bilateral, moderate stage: Secondary | ICD-10-CM | POA: Diagnosis not present

## 2019-03-06 ENCOUNTER — Ambulatory Visit
Admission: RE | Admit: 2019-03-06 | Discharge: 2019-03-06 | Disposition: A | Discharge status: Home / Self Care | Payer: Medicare HMO | Source: Ambulatory Visit | Attending: Internal Medicine | Admitting: Internal Medicine

## 2019-03-06 ENCOUNTER — Other Ambulatory Visit: Payer: Self-pay | Admitting: Internal Medicine

## 2019-03-06 ENCOUNTER — Other Ambulatory Visit: Payer: Self-pay

## 2019-03-06 DIAGNOSIS — R928 Other abnormal and inconclusive findings on diagnostic imaging of breast: Secondary | ICD-10-CM

## 2019-03-06 DIAGNOSIS — N6321 Unspecified lump in the left breast, upper outer quadrant: Secondary | ICD-10-CM | POA: Diagnosis not present

## 2019-03-06 DIAGNOSIS — R599 Enlarged lymph nodes, unspecified: Secondary | ICD-10-CM

## 2019-03-06 DIAGNOSIS — N6320 Unspecified lump in the left breast, unspecified quadrant: Secondary | ICD-10-CM

## 2019-03-12 ENCOUNTER — Ambulatory Visit
Admission: RE | Admit: 2019-03-12 | Discharge: 2019-03-12 | Disposition: A | Discharge status: Home / Self Care | Payer: Medicare HMO | Source: Ambulatory Visit | Attending: Internal Medicine | Admitting: Internal Medicine

## 2019-03-12 ENCOUNTER — Other Ambulatory Visit: Payer: Self-pay

## 2019-03-12 DIAGNOSIS — R590 Localized enlarged lymph nodes: Secondary | ICD-10-CM | POA: Diagnosis not present

## 2019-03-12 DIAGNOSIS — N6320 Unspecified lump in the left breast, unspecified quadrant: Secondary | ICD-10-CM

## 2019-03-12 DIAGNOSIS — R599 Enlarged lymph nodes, unspecified: Secondary | ICD-10-CM

## 2019-03-12 DIAGNOSIS — N6321 Unspecified lump in the left breast, upper outer quadrant: Secondary | ICD-10-CM | POA: Diagnosis not present

## 2019-03-12 DIAGNOSIS — N641 Fat necrosis of breast: Secondary | ICD-10-CM | POA: Diagnosis not present

## 2019-03-12 HISTORY — PX: BREAST BIOPSY: SHX20

## 2019-03-13 DIAGNOSIS — R69 Illness, unspecified: Secondary | ICD-10-CM | POA: Diagnosis not present

## 2019-03-18 DIAGNOSIS — E119 Type 2 diabetes mellitus without complications: Secondary | ICD-10-CM | POA: Diagnosis not present

## 2019-03-18 DIAGNOSIS — J209 Acute bronchitis, unspecified: Secondary | ICD-10-CM | POA: Diagnosis not present

## 2019-03-23 DIAGNOSIS — R05 Cough: Secondary | ICD-10-CM | POA: Diagnosis not present

## 2019-03-23 DIAGNOSIS — J189 Pneumonia, unspecified organism: Secondary | ICD-10-CM | POA: Diagnosis not present

## 2019-04-09 DIAGNOSIS — E538 Deficiency of other specified B group vitamins: Secondary | ICD-10-CM | POA: Diagnosis not present

## 2019-04-13 DIAGNOSIS — J189 Pneumonia, unspecified organism: Secondary | ICD-10-CM | POA: Diagnosis not present

## 2019-05-11 DIAGNOSIS — J189 Pneumonia, unspecified organism: Secondary | ICD-10-CM | POA: Diagnosis not present

## 2019-05-14 DIAGNOSIS — I10 Essential (primary) hypertension: Secondary | ICD-10-CM | POA: Diagnosis not present

## 2019-05-14 DIAGNOSIS — E1143 Type 2 diabetes mellitus with diabetic autonomic (poly)neuropathy: Secondary | ICD-10-CM | POA: Diagnosis not present

## 2019-05-14 DIAGNOSIS — E1136 Type 2 diabetes mellitus with diabetic cataract: Secondary | ICD-10-CM | POA: Diagnosis not present

## 2019-05-14 DIAGNOSIS — R0981 Nasal congestion: Secondary | ICD-10-CM | POA: Diagnosis not present

## 2019-05-14 DIAGNOSIS — M810 Age-related osteoporosis without current pathological fracture: Secondary | ICD-10-CM | POA: Diagnosis not present

## 2019-05-14 DIAGNOSIS — E559 Vitamin D deficiency, unspecified: Secondary | ICD-10-CM | POA: Diagnosis not present

## 2019-05-14 DIAGNOSIS — E785 Hyperlipidemia, unspecified: Secondary | ICD-10-CM | POA: Diagnosis not present

## 2019-05-14 DIAGNOSIS — E538 Deficiency of other specified B group vitamins: Secondary | ICD-10-CM | POA: Diagnosis not present

## 2019-05-14 DIAGNOSIS — E1165 Type 2 diabetes mellitus with hyperglycemia: Secondary | ICD-10-CM | POA: Diagnosis not present

## 2019-05-14 DIAGNOSIS — Z23 Encounter for immunization: Secondary | ICD-10-CM | POA: Diagnosis not present

## 2019-05-25 DIAGNOSIS — R69 Illness, unspecified: Secondary | ICD-10-CM | POA: Diagnosis not present

## 2019-05-28 DIAGNOSIS — R0981 Nasal congestion: Secondary | ICD-10-CM | POA: Diagnosis not present

## 2019-05-28 DIAGNOSIS — Z20828 Contact with and (suspected) exposure to other viral communicable diseases: Secondary | ICD-10-CM | POA: Diagnosis not present

## 2019-05-30 DIAGNOSIS — R69 Illness, unspecified: Secondary | ICD-10-CM | POA: Diagnosis not present

## 2019-06-18 DIAGNOSIS — E538 Deficiency of other specified B group vitamins: Secondary | ICD-10-CM | POA: Diagnosis not present

## 2019-06-26 DIAGNOSIS — H401132 Primary open-angle glaucoma, bilateral, moderate stage: Secondary | ICD-10-CM | POA: Diagnosis not present

## 2019-06-26 DIAGNOSIS — H47233 Glaucomatous optic atrophy, bilateral: Secondary | ICD-10-CM | POA: Diagnosis not present

## 2019-06-26 DIAGNOSIS — E119 Type 2 diabetes mellitus without complications: Secondary | ICD-10-CM | POA: Diagnosis not present

## 2019-06-26 DIAGNOSIS — H401192 Primary open-angle glaucoma, unspecified eye, moderate stage: Secondary | ICD-10-CM | POA: Diagnosis not present

## 2019-06-26 DIAGNOSIS — Z7984 Long term (current) use of oral hypoglycemic drugs: Secondary | ICD-10-CM | POA: Diagnosis not present

## 2019-06-26 DIAGNOSIS — H25813 Combined forms of age-related cataract, bilateral: Secondary | ICD-10-CM | POA: Diagnosis not present

## 2019-06-26 DIAGNOSIS — H35372 Puckering of macula, left eye: Secondary | ICD-10-CM | POA: Diagnosis not present

## 2019-06-26 DIAGNOSIS — H16141 Punctate keratitis, right eye: Secondary | ICD-10-CM | POA: Diagnosis not present

## 2019-07-16 DIAGNOSIS — R69 Illness, unspecified: Secondary | ICD-10-CM | POA: Diagnosis not present

## 2019-07-16 DIAGNOSIS — F331 Major depressive disorder, recurrent, moderate: Secondary | ICD-10-CM | POA: Diagnosis not present

## 2019-07-23 DIAGNOSIS — E538 Deficiency of other specified B group vitamins: Secondary | ICD-10-CM | POA: Diagnosis not present

## 2019-08-27 DIAGNOSIS — E538 Deficiency of other specified B group vitamins: Secondary | ICD-10-CM | POA: Diagnosis not present

## 2019-08-28 DIAGNOSIS — R69 Illness, unspecified: Secondary | ICD-10-CM | POA: Diagnosis not present

## 2019-09-17 DIAGNOSIS — Z6828 Body mass index (BMI) 28.0-28.9, adult: Secondary | ICD-10-CM | POA: Diagnosis not present

## 2019-09-17 DIAGNOSIS — M810 Age-related osteoporosis without current pathological fracture: Secondary | ICD-10-CM | POA: Diagnosis not present

## 2019-09-17 DIAGNOSIS — E1136 Type 2 diabetes mellitus with diabetic cataract: Secondary | ICD-10-CM | POA: Diagnosis not present

## 2019-09-17 DIAGNOSIS — E785 Hyperlipidemia, unspecified: Secondary | ICD-10-CM | POA: Diagnosis not present

## 2019-09-17 DIAGNOSIS — E559 Vitamin D deficiency, unspecified: Secondary | ICD-10-CM | POA: Diagnosis not present

## 2019-09-17 DIAGNOSIS — I10 Essential (primary) hypertension: Secondary | ICD-10-CM | POA: Diagnosis not present

## 2019-09-17 DIAGNOSIS — E538 Deficiency of other specified B group vitamins: Secondary | ICD-10-CM | POA: Diagnosis not present

## 2019-09-17 DIAGNOSIS — J329 Chronic sinusitis, unspecified: Secondary | ICD-10-CM | POA: Diagnosis not present

## 2019-09-17 DIAGNOSIS — E1165 Type 2 diabetes mellitus with hyperglycemia: Secondary | ICD-10-CM | POA: Diagnosis not present

## 2019-09-17 DIAGNOSIS — E1143 Type 2 diabetes mellitus with diabetic autonomic (poly)neuropathy: Secondary | ICD-10-CM | POA: Diagnosis not present

## 2019-09-24 DIAGNOSIS — E119 Type 2 diabetes mellitus without complications: Secondary | ICD-10-CM | POA: Diagnosis not present

## 2019-09-24 DIAGNOSIS — H401131 Primary open-angle glaucoma, bilateral, mild stage: Secondary | ICD-10-CM | POA: Diagnosis not present

## 2019-09-24 DIAGNOSIS — H25812 Combined forms of age-related cataract, left eye: Secondary | ICD-10-CM | POA: Diagnosis not present

## 2019-09-24 DIAGNOSIS — Z01818 Encounter for other preprocedural examination: Secondary | ICD-10-CM | POA: Diagnosis not present

## 2019-09-25 DIAGNOSIS — E538 Deficiency of other specified B group vitamins: Secondary | ICD-10-CM | POA: Diagnosis not present

## 2019-09-25 DIAGNOSIS — R69 Illness, unspecified: Secondary | ICD-10-CM | POA: Diagnosis not present

## 2019-10-01 DIAGNOSIS — H401120 Primary open-angle glaucoma, left eye, stage unspecified: Secondary | ICD-10-CM | POA: Diagnosis not present

## 2019-10-01 DIAGNOSIS — G629 Polyneuropathy, unspecified: Secondary | ICD-10-CM | POA: Diagnosis not present

## 2019-10-01 DIAGNOSIS — H401121 Primary open-angle glaucoma, left eye, mild stage: Secondary | ICD-10-CM | POA: Diagnosis not present

## 2019-10-01 DIAGNOSIS — Z79899 Other long term (current) drug therapy: Secondary | ICD-10-CM | POA: Diagnosis not present

## 2019-10-01 DIAGNOSIS — H52223 Regular astigmatism, bilateral: Secondary | ICD-10-CM | POA: Diagnosis not present

## 2019-10-01 DIAGNOSIS — Z7984 Long term (current) use of oral hypoglycemic drugs: Secondary | ICD-10-CM | POA: Diagnosis not present

## 2019-10-01 DIAGNOSIS — H5203 Hypermetropia, bilateral: Secondary | ICD-10-CM | POA: Diagnosis not present

## 2019-10-01 DIAGNOSIS — H401131 Primary open-angle glaucoma, bilateral, mild stage: Secondary | ICD-10-CM | POA: Diagnosis not present

## 2019-10-01 DIAGNOSIS — H25812 Combined forms of age-related cataract, left eye: Secondary | ICD-10-CM | POA: Diagnosis not present

## 2019-10-01 DIAGNOSIS — E785 Hyperlipidemia, unspecified: Secondary | ICD-10-CM | POA: Diagnosis not present

## 2019-10-01 DIAGNOSIS — H2512 Age-related nuclear cataract, left eye: Secondary | ICD-10-CM | POA: Diagnosis not present

## 2019-10-01 DIAGNOSIS — I10 Essential (primary) hypertension: Secondary | ICD-10-CM | POA: Diagnosis not present

## 2019-10-01 DIAGNOSIS — H524 Presbyopia: Secondary | ICD-10-CM | POA: Diagnosis not present

## 2019-10-01 DIAGNOSIS — E1136 Type 2 diabetes mellitus with diabetic cataract: Secondary | ICD-10-CM | POA: Diagnosis not present

## 2019-10-22 DIAGNOSIS — Z7982 Long term (current) use of aspirin: Secondary | ICD-10-CM | POA: Diagnosis not present

## 2019-10-22 DIAGNOSIS — E785 Hyperlipidemia, unspecified: Secondary | ICD-10-CM | POA: Diagnosis not present

## 2019-10-22 DIAGNOSIS — E1136 Type 2 diabetes mellitus with diabetic cataract: Secondary | ICD-10-CM | POA: Diagnosis not present

## 2019-10-22 DIAGNOSIS — H52223 Regular astigmatism, bilateral: Secondary | ICD-10-CM | POA: Diagnosis not present

## 2019-10-22 DIAGNOSIS — H401110 Primary open-angle glaucoma, right eye, stage unspecified: Secondary | ICD-10-CM | POA: Diagnosis not present

## 2019-10-22 DIAGNOSIS — R69 Illness, unspecified: Secondary | ICD-10-CM | POA: Diagnosis not present

## 2019-10-22 DIAGNOSIS — I10 Essential (primary) hypertension: Secondary | ICD-10-CM | POA: Diagnosis not present

## 2019-10-22 DIAGNOSIS — H2511 Age-related nuclear cataract, right eye: Secondary | ICD-10-CM | POA: Diagnosis not present

## 2019-10-22 DIAGNOSIS — Z961 Presence of intraocular lens: Secondary | ICD-10-CM | POA: Diagnosis not present

## 2019-10-22 DIAGNOSIS — Z9849 Cataract extraction status, unspecified eye: Secondary | ICD-10-CM | POA: Diagnosis not present

## 2019-10-22 DIAGNOSIS — K219 Gastro-esophageal reflux disease without esophagitis: Secondary | ICD-10-CM | POA: Diagnosis not present

## 2019-10-22 DIAGNOSIS — Z79899 Other long term (current) drug therapy: Secondary | ICD-10-CM | POA: Diagnosis not present

## 2019-10-22 DIAGNOSIS — H25811 Combined forms of age-related cataract, right eye: Secondary | ICD-10-CM | POA: Diagnosis not present

## 2019-10-22 DIAGNOSIS — H5203 Hypermetropia, bilateral: Secondary | ICD-10-CM | POA: Diagnosis not present

## 2019-10-22 DIAGNOSIS — H401111 Primary open-angle glaucoma, right eye, mild stage: Secondary | ICD-10-CM | POA: Diagnosis not present

## 2019-10-22 DIAGNOSIS — G629 Polyneuropathy, unspecified: Secondary | ICD-10-CM | POA: Diagnosis not present

## 2019-10-30 DIAGNOSIS — E538 Deficiency of other specified B group vitamins: Secondary | ICD-10-CM | POA: Diagnosis not present

## 2019-11-19 DIAGNOSIS — Z0100 Encounter for examination of eyes and vision without abnormal findings: Secondary | ICD-10-CM | POA: Diagnosis not present

## 2019-12-03 DIAGNOSIS — E538 Deficiency of other specified B group vitamins: Secondary | ICD-10-CM | POA: Diagnosis not present

## 2019-12-13 DIAGNOSIS — R69 Illness, unspecified: Secondary | ICD-10-CM | POA: Diagnosis not present

## 2020-01-07 DIAGNOSIS — R69 Illness, unspecified: Secondary | ICD-10-CM | POA: Diagnosis not present

## 2020-01-07 DIAGNOSIS — E538 Deficiency of other specified B group vitamins: Secondary | ICD-10-CM | POA: Diagnosis not present

## 2020-01-15 DIAGNOSIS — Z139 Encounter for screening, unspecified: Secondary | ICD-10-CM | POA: Diagnosis not present

## 2020-01-15 DIAGNOSIS — Z9181 History of falling: Secondary | ICD-10-CM | POA: Diagnosis not present

## 2020-01-15 DIAGNOSIS — I10 Essential (primary) hypertension: Secondary | ICD-10-CM | POA: Diagnosis not present

## 2020-01-15 DIAGNOSIS — Z1331 Encounter for screening for depression: Secondary | ICD-10-CM | POA: Diagnosis not present

## 2020-01-15 DIAGNOSIS — E1136 Type 2 diabetes mellitus with diabetic cataract: Secondary | ICD-10-CM | POA: Diagnosis not present

## 2020-01-15 DIAGNOSIS — R609 Edema, unspecified: Secondary | ICD-10-CM | POA: Diagnosis not present

## 2020-01-15 DIAGNOSIS — E538 Deficiency of other specified B group vitamins: Secondary | ICD-10-CM | POA: Diagnosis not present

## 2020-01-15 DIAGNOSIS — E1165 Type 2 diabetes mellitus with hyperglycemia: Secondary | ICD-10-CM | POA: Diagnosis not present

## 2020-01-15 DIAGNOSIS — E785 Hyperlipidemia, unspecified: Secondary | ICD-10-CM | POA: Diagnosis not present

## 2020-01-15 DIAGNOSIS — E1143 Type 2 diabetes mellitus with diabetic autonomic (poly)neuropathy: Secondary | ICD-10-CM | POA: Diagnosis not present

## 2020-01-15 DIAGNOSIS — Z0000 Encounter for general adult medical examination without abnormal findings: Secondary | ICD-10-CM | POA: Diagnosis not present

## 2020-01-16 ENCOUNTER — Other Ambulatory Visit: Payer: Self-pay | Admitting: Family Medicine

## 2020-01-16 DIAGNOSIS — R5381 Other malaise: Secondary | ICD-10-CM

## 2020-01-16 DIAGNOSIS — Z1231 Encounter for screening mammogram for malignant neoplasm of breast: Secondary | ICD-10-CM

## 2020-01-24 ENCOUNTER — Other Ambulatory Visit: Payer: Self-pay | Admitting: Family Medicine

## 2020-01-24 DIAGNOSIS — M8589 Other specified disorders of bone density and structure, multiple sites: Secondary | ICD-10-CM

## 2020-02-11 DIAGNOSIS — E538 Deficiency of other specified B group vitamins: Secondary | ICD-10-CM | POA: Diagnosis not present

## 2020-02-19 DIAGNOSIS — J329 Chronic sinusitis, unspecified: Secondary | ICD-10-CM | POA: Diagnosis not present

## 2020-02-19 DIAGNOSIS — M25562 Pain in left knee: Secondary | ICD-10-CM | POA: Diagnosis not present

## 2020-02-19 DIAGNOSIS — M7989 Other specified soft tissue disorders: Secondary | ICD-10-CM | POA: Diagnosis not present

## 2020-02-19 DIAGNOSIS — M25469 Effusion, unspecified knee: Secondary | ICD-10-CM | POA: Diagnosis not present

## 2020-02-19 DIAGNOSIS — R609 Edema, unspecified: Secondary | ICD-10-CM | POA: Diagnosis not present

## 2020-02-22 DIAGNOSIS — M25462 Effusion, left knee: Secondary | ICD-10-CM | POA: Diagnosis not present

## 2020-02-22 DIAGNOSIS — R2242 Localized swelling, mass and lump, left lower limb: Secondary | ICD-10-CM | POA: Diagnosis not present

## 2020-02-22 DIAGNOSIS — M7989 Other specified soft tissue disorders: Secondary | ICD-10-CM | POA: Diagnosis not present

## 2020-02-26 DIAGNOSIS — H47233 Glaucomatous optic atrophy, bilateral: Secondary | ICD-10-CM | POA: Diagnosis not present

## 2020-02-26 DIAGNOSIS — H401132 Primary open-angle glaucoma, bilateral, moderate stage: Secondary | ICD-10-CM | POA: Diagnosis not present

## 2020-03-03 DIAGNOSIS — G629 Polyneuropathy, unspecified: Secondary | ICD-10-CM | POA: Diagnosis not present

## 2020-03-04 DIAGNOSIS — F331 Major depressive disorder, recurrent, moderate: Secondary | ICD-10-CM | POA: Diagnosis not present

## 2020-03-04 DIAGNOSIS — R69 Illness, unspecified: Secondary | ICD-10-CM | POA: Diagnosis not present

## 2020-03-10 DIAGNOSIS — M7989 Other specified soft tissue disorders: Secondary | ICD-10-CM | POA: Diagnosis not present

## 2020-03-17 DIAGNOSIS — E538 Deficiency of other specified B group vitamins: Secondary | ICD-10-CM | POA: Diagnosis not present

## 2020-03-19 DIAGNOSIS — R2242 Localized swelling, mass and lump, left lower limb: Secondary | ICD-10-CM | POA: Diagnosis not present

## 2020-03-19 DIAGNOSIS — M7989 Other specified soft tissue disorders: Secondary | ICD-10-CM | POA: Diagnosis not present

## 2020-03-19 DIAGNOSIS — M25562 Pain in left knee: Secondary | ICD-10-CM | POA: Diagnosis not present

## 2020-03-30 DIAGNOSIS — M7989 Other specified soft tissue disorders: Secondary | ICD-10-CM | POA: Diagnosis not present

## 2020-03-30 DIAGNOSIS — R69 Illness, unspecified: Secondary | ICD-10-CM | POA: Diagnosis not present

## 2020-04-28 ENCOUNTER — Ambulatory Visit
Admission: RE | Admit: 2020-04-28 | Discharge: 2020-04-28 | Disposition: A | Discharge status: Home / Self Care | Payer: Medicare HMO | Source: Ambulatory Visit | Attending: Family Medicine | Admitting: Family Medicine

## 2020-04-28 ENCOUNTER — Other Ambulatory Visit: Payer: Self-pay

## 2020-04-28 DIAGNOSIS — Z1231 Encounter for screening mammogram for malignant neoplasm of breast: Secondary | ICD-10-CM

## 2020-04-28 DIAGNOSIS — M8589 Other specified disorders of bone density and structure, multiple sites: Secondary | ICD-10-CM

## 2020-07-29 DIAGNOSIS — E538 Deficiency of other specified B group vitamins: Secondary | ICD-10-CM | POA: Diagnosis not present

## 2020-09-01 DIAGNOSIS — R69 Illness, unspecified: Secondary | ICD-10-CM | POA: Diagnosis not present

## 2020-09-01 DIAGNOSIS — F331 Major depressive disorder, recurrent, moderate: Secondary | ICD-10-CM | POA: Diagnosis not present

## 2020-09-02 DIAGNOSIS — E538 Deficiency of other specified B group vitamins: Secondary | ICD-10-CM | POA: Diagnosis not present

## 2020-09-04 DIAGNOSIS — H47233 Glaucomatous optic atrophy, bilateral: Secondary | ICD-10-CM | POA: Diagnosis not present

## 2020-09-04 DIAGNOSIS — H401132 Primary open-angle glaucoma, bilateral, moderate stage: Secondary | ICD-10-CM | POA: Diagnosis not present

## 2020-09-21 IMAGING — US ULTRASOUND LEFT BREAST LIMITED
1 series · 13 of 16 positions shown · non-contrast
Comparison: Previous exam(s).

CLINICAL DATA: 77-year-old female for further evaluation of
possible LEFT breast mass on screening mammogram. Patient denies
history of trauma or breast bruising.

EXAM:
DIGITAL DIAGNOSTIC LEFT MAMMOGRAM WITH TOMO
ULTRASOUND LEFT BREAST

[Series 1: ultrasound left breast limited · 0.07mm/px · 13 of 16 slices shown]
[im 1/16]
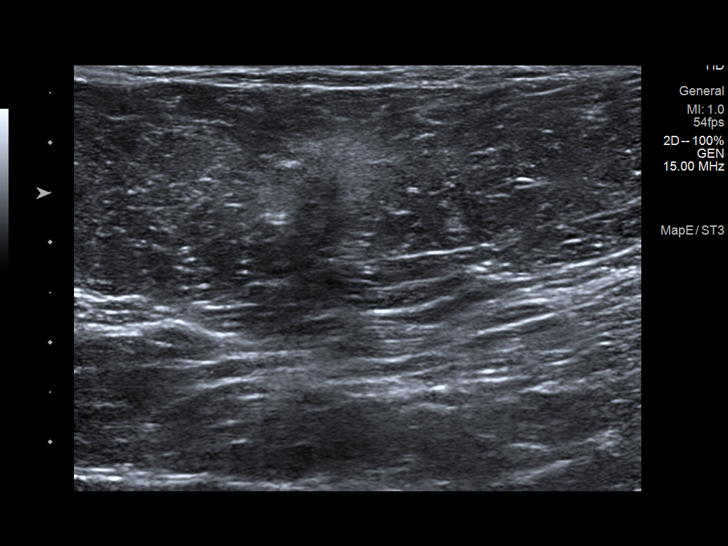
[im 2/16]
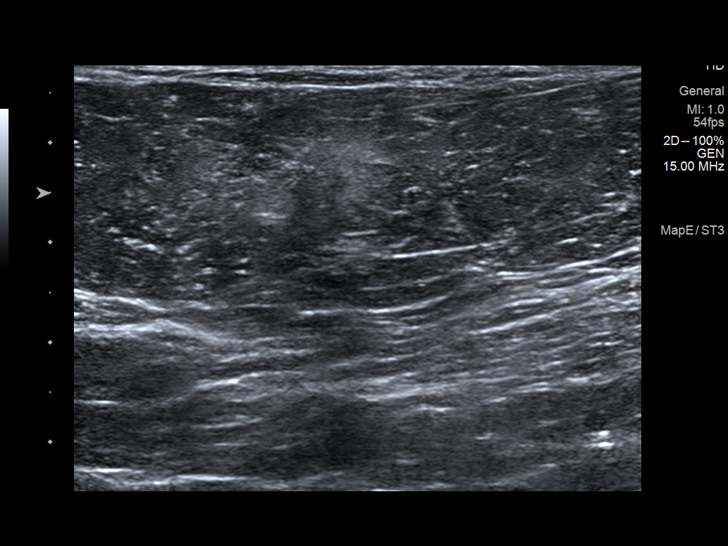
[im 4/16]
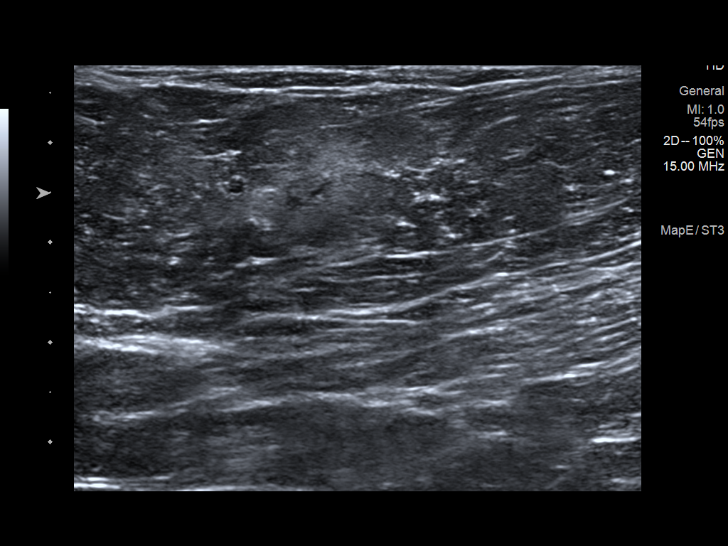
[im 5/16]
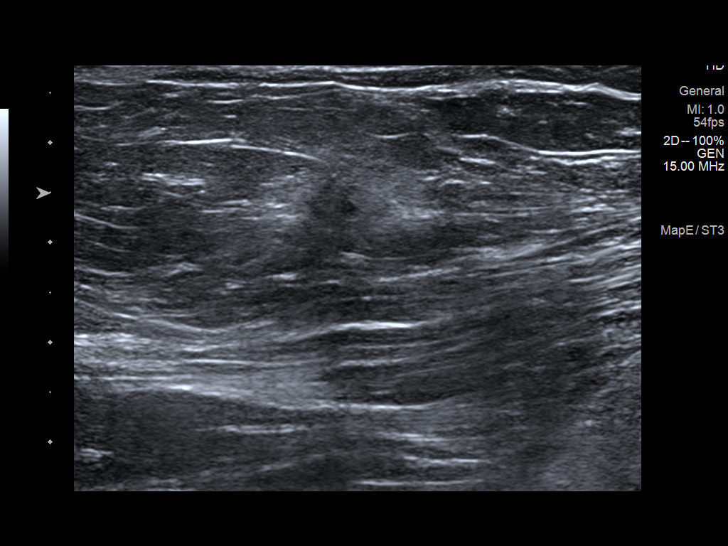
[im 6/16]
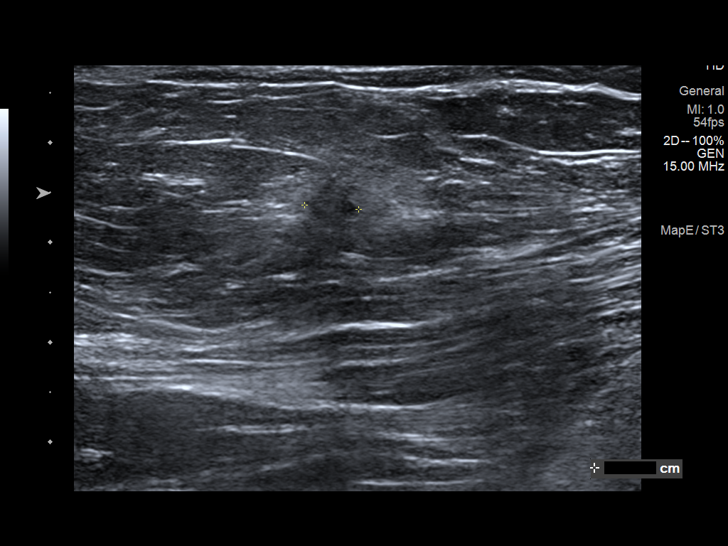
[im 7/16]
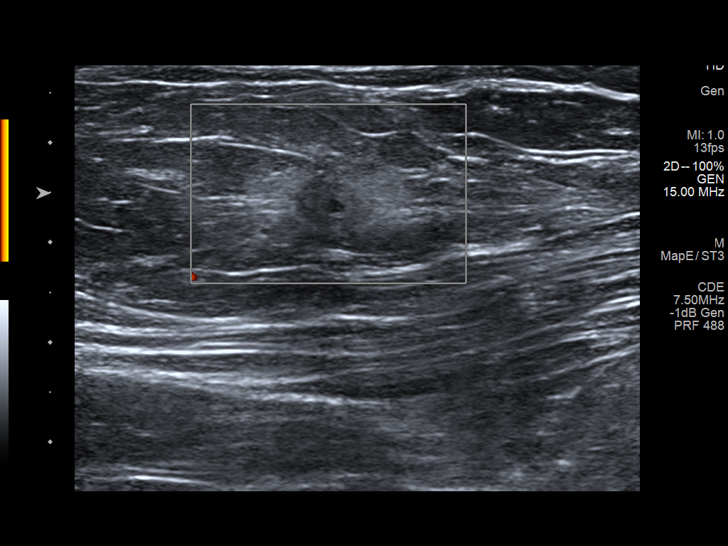
[im 9/16]
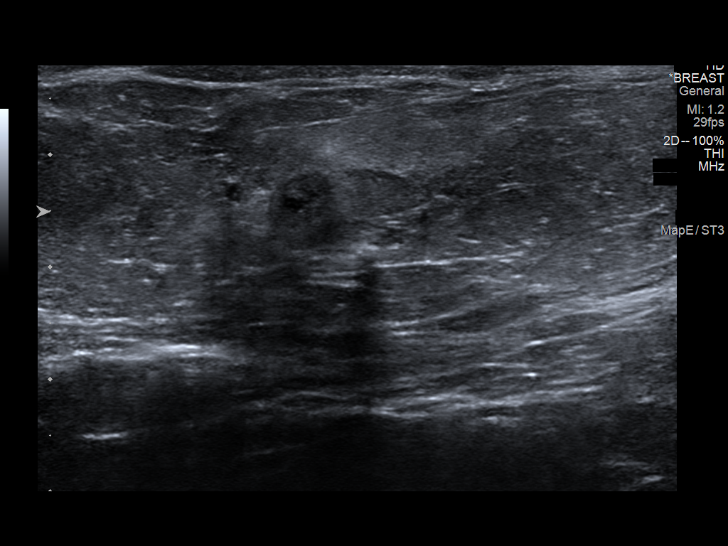
[im 10/16]
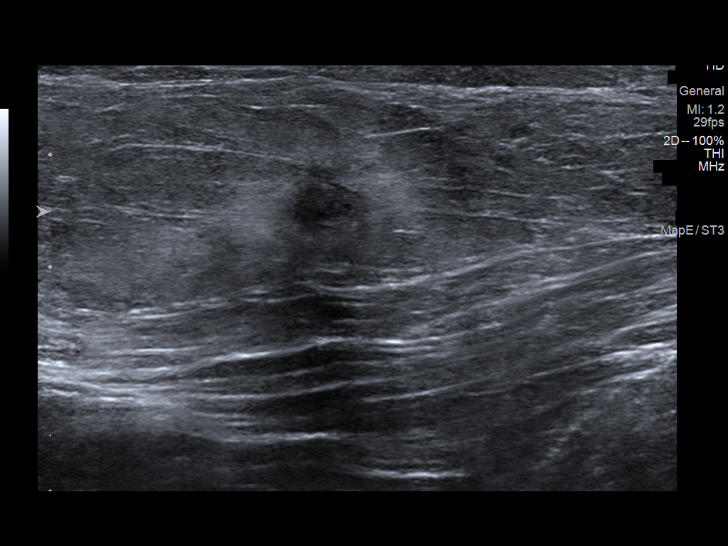
[im 11/16]
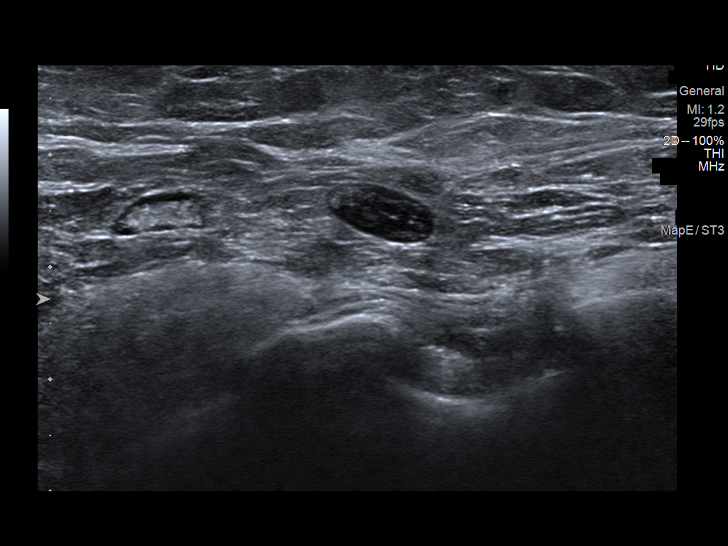
[im 12/16]
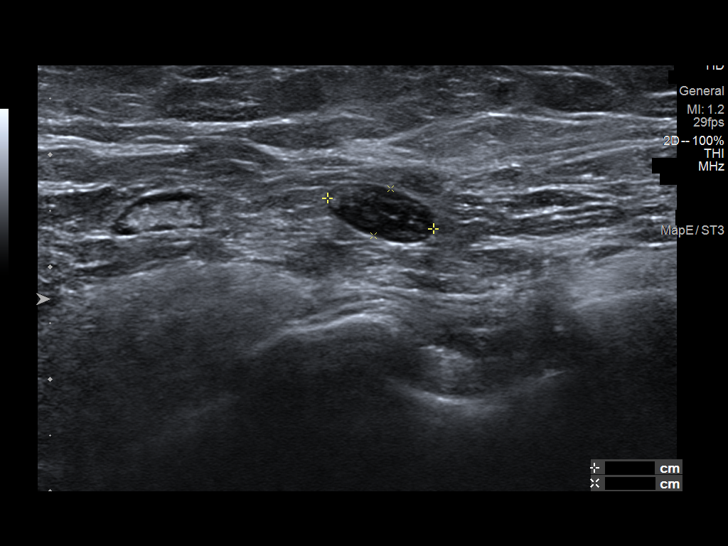
[im 13/16]
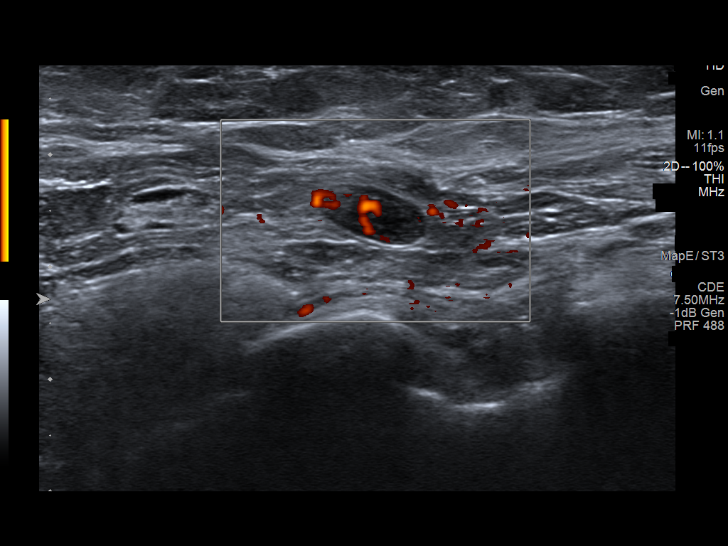
[im 15/16]
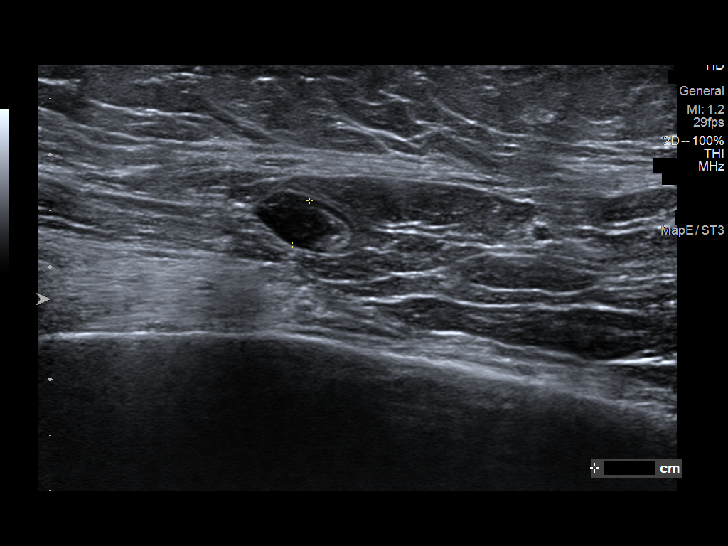
[im 16/16]
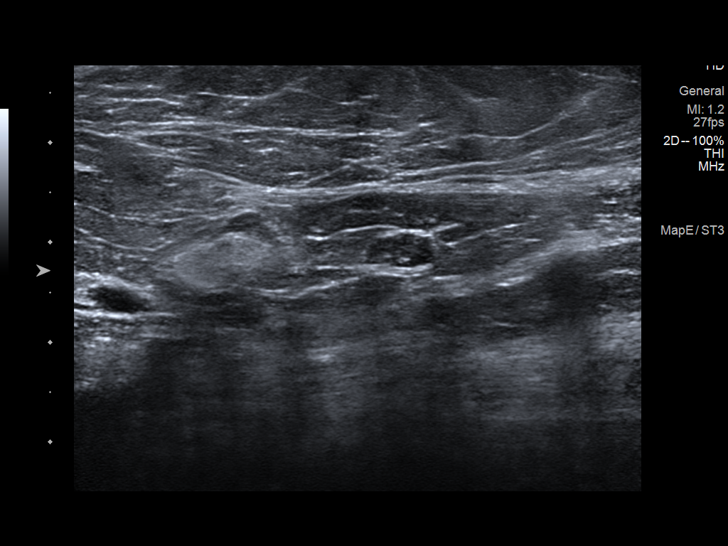

[13 of 16 positions shown; findings below may reference images not displayed]

ACR Breast Density Category b: There are scattered areas of
fibroglandular density.
FINDINGS: 2D/3D spot compression views of the LEFT breast demonstrate a
persistent 0.6 cm obscured oval mass within the UPPER-OUTER LEFT
breast.

Targeted ultrasound is performed, showing a 0.6 x 0.4 x 0.5 cm
irregular hypoechoic area/mass with surrounding echogenicity at the
[DATE] position of the LEFT breast 5 cm from the nipple.

A single abnormal LOWER LEFT axillary lymph node with focal cortical
thickening of 4 mm is noted.
IMPRESSION: 1. Indeterminate 0.6 cm irregular hypoechoic area/mass in the
UPPER-OUTER LEFT breast with 1 abnormal LEFT axillary lymph node
with focal cortical thickening. Tissue sampling of both of these
areas recommended.

RECOMMENDATION:
1. Ultrasound-guided biopsy of 0.6 cm UPPER-OUTER LEFT breast mass
and abnormal LEFT axillary lymph node, which will be scheduled.

I have discussed the findings and recommendations with the patient.
If applicable, a reminder letter will be sent to the patient
regarding the next appointment.

BI-RADS CATEGORY  4: Suspicious.

## 2020-09-21 IMAGING — MG DIGITAL DIAGNOSTIC UNILATERAL LEFT MAMMOGRAM WITH TOMO AND CAD
4 series · 4 of 12 positions shown · non-contrast
Comparison: Previous exam(s).

CLINICAL DATA: 77-year-old female for further evaluation of
possible LEFT breast mass on screening mammogram. Patient denies
history of trauma or breast bruising.

EXAM:
DIGITAL DIAGNOSTIC LEFT MAMMOGRAM WITH TOMO
ULTRASOUND LEFT BREAST

[L CC synth-2D]
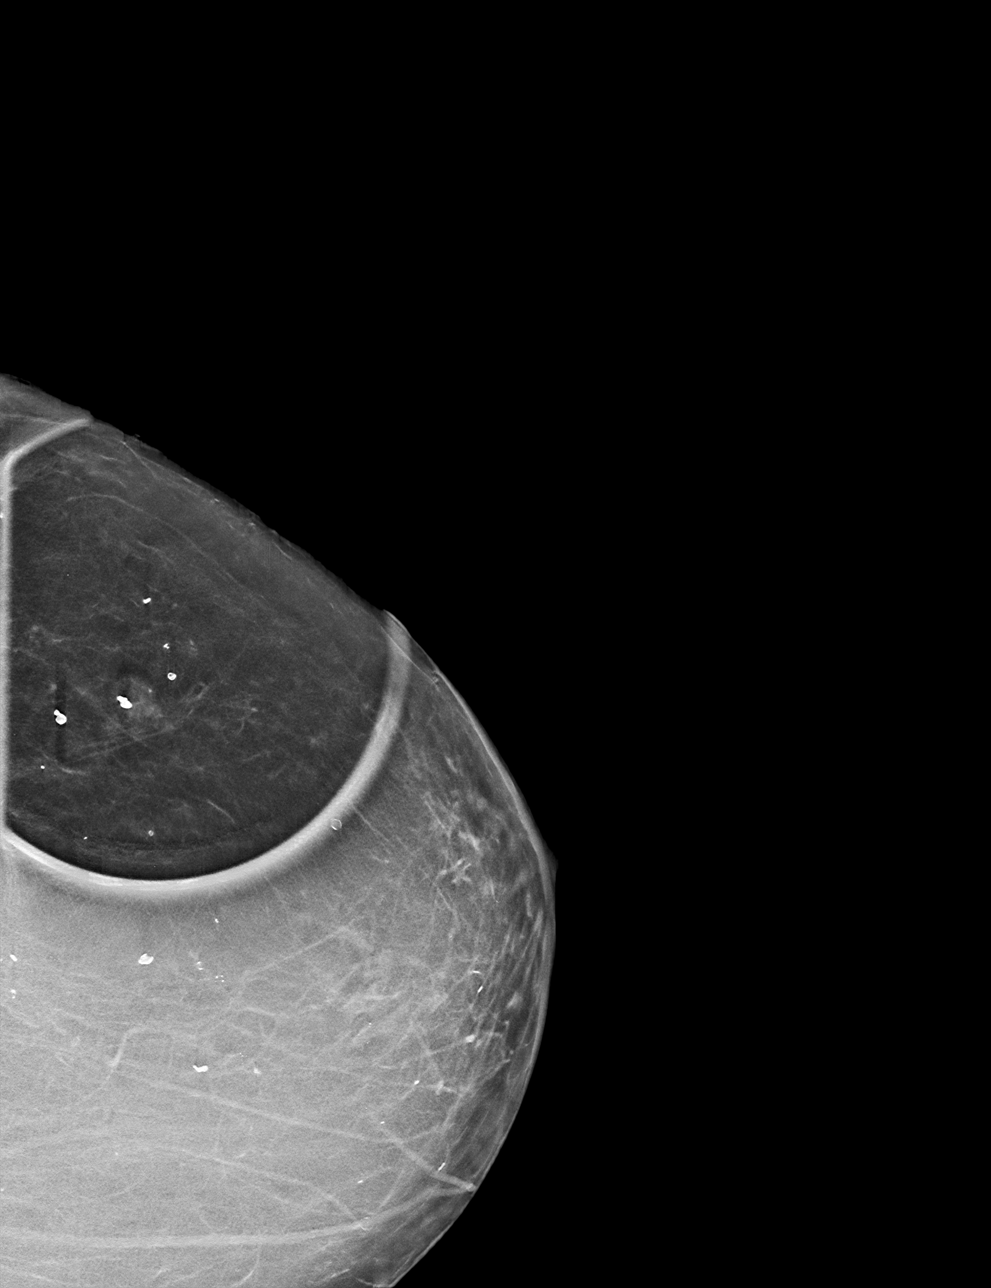

[L MLO synth-2D]
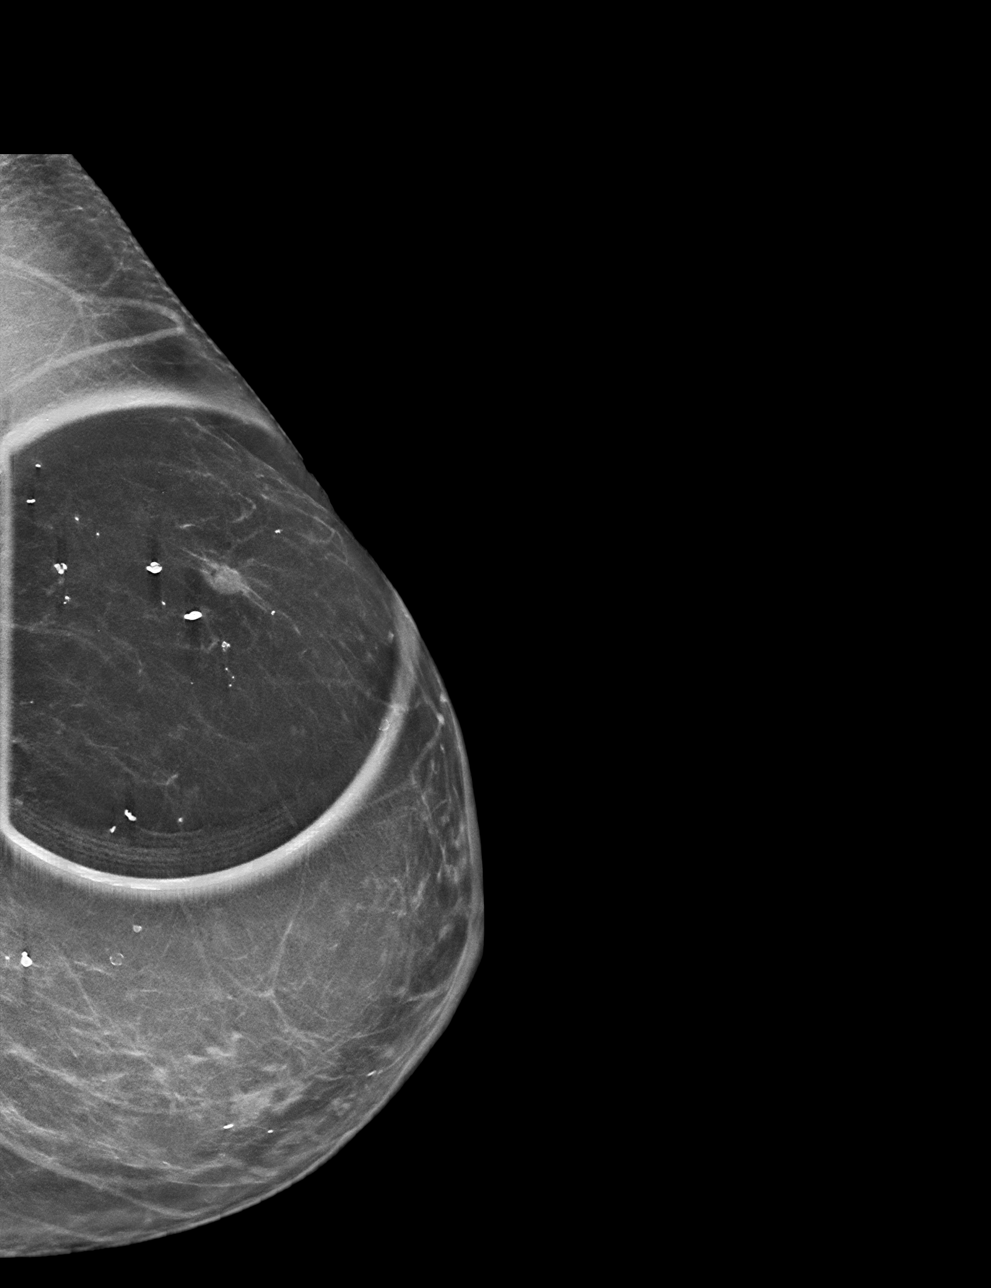

[L MLO tomo · tomo slice 29/56.0]
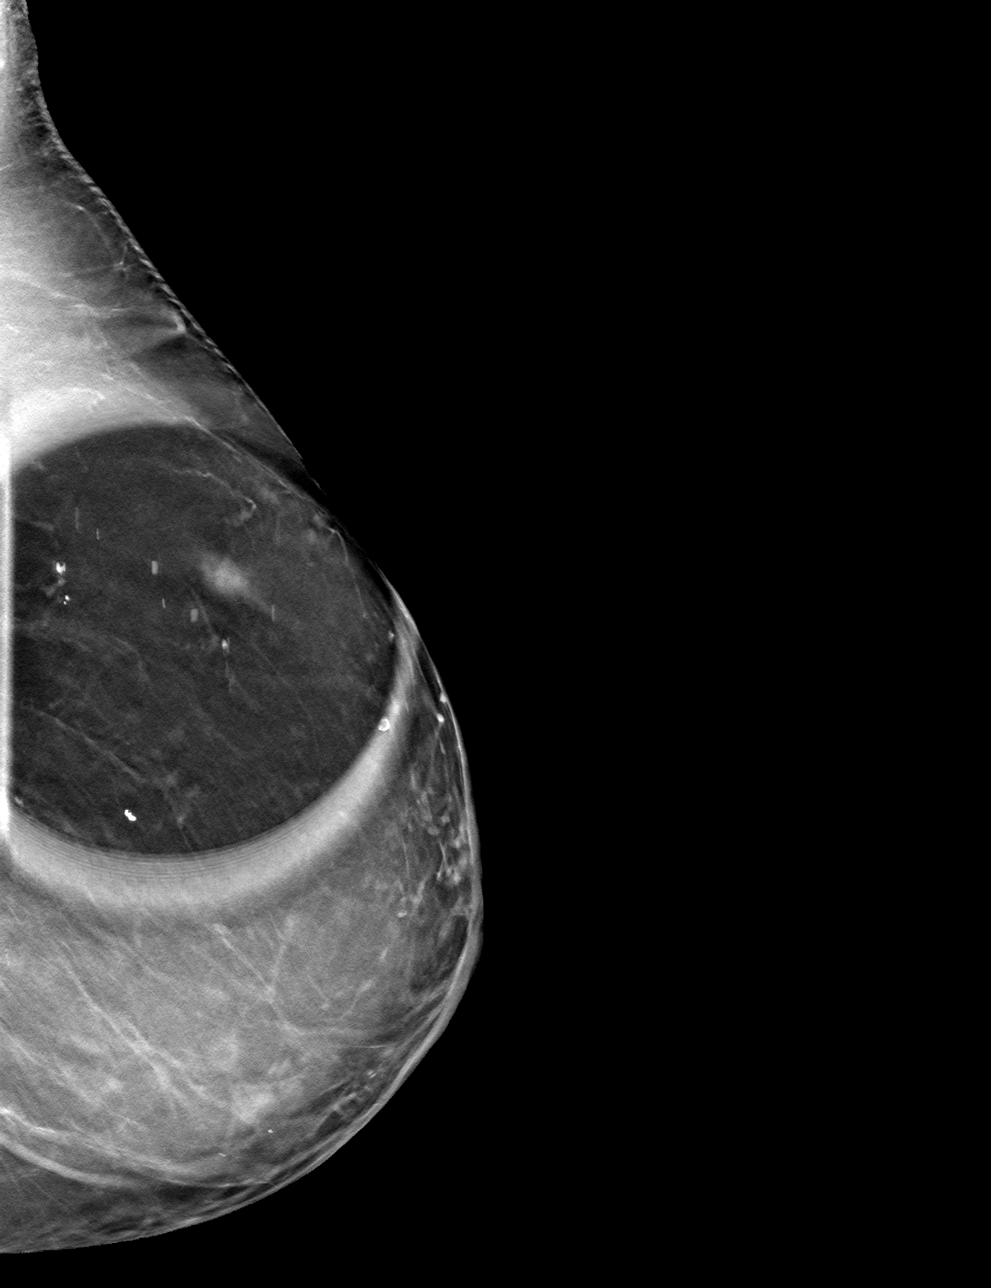

[L CC tomo · tomo slice 23/46.0]
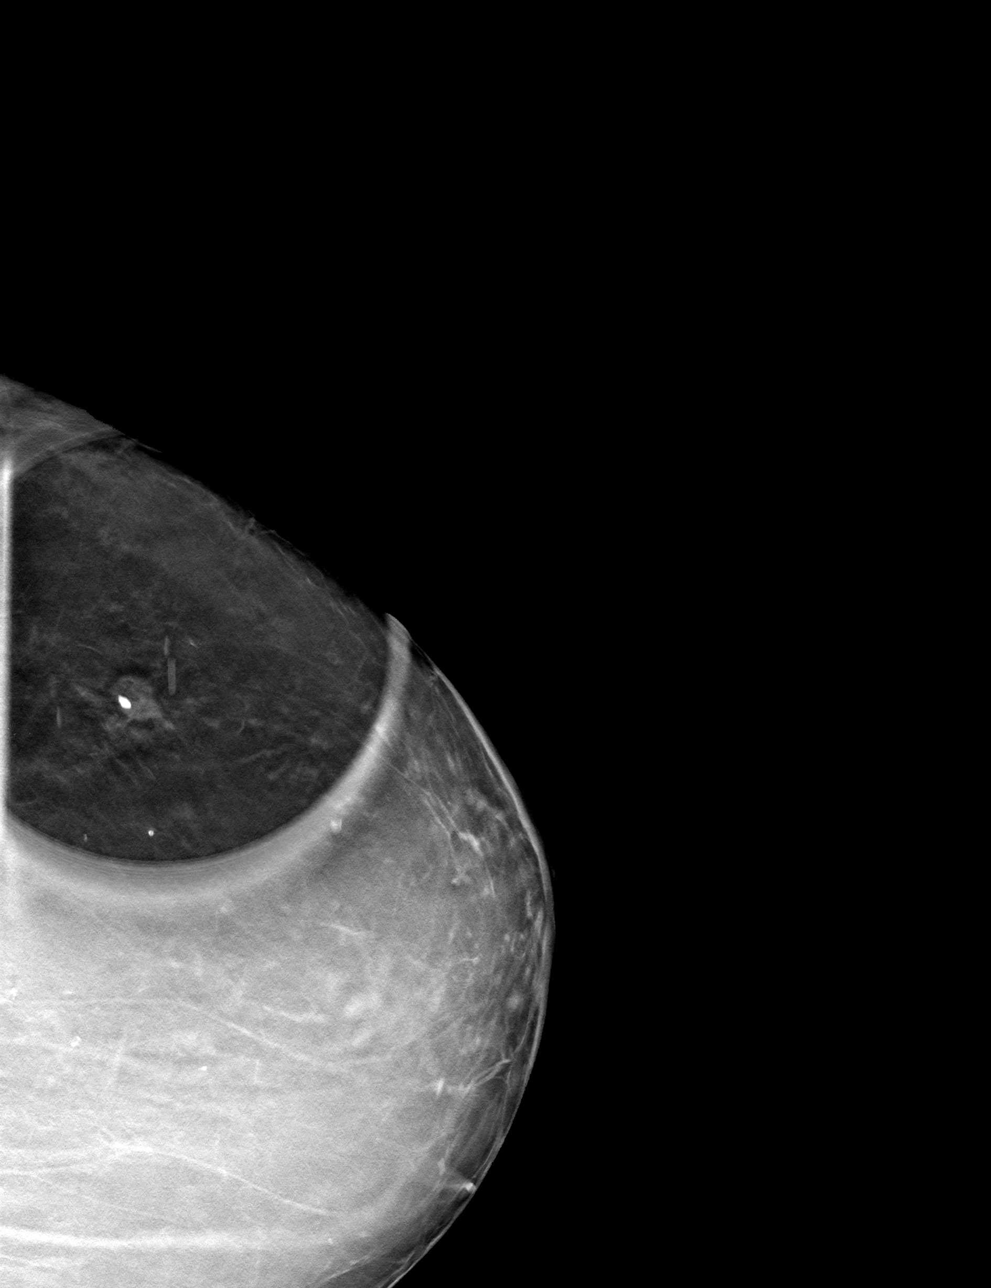

[4 of 12 positions shown; findings below may reference images not displayed]

ACR Breast Density Category b: There are scattered areas of
fibroglandular density.
FINDINGS: 2D/3D spot compression views of the LEFT breast demonstrate a
persistent 0.6 cm obscured oval mass within the UPPER-OUTER LEFT
breast.

Targeted ultrasound is performed, showing a 0.6 x 0.4 x 0.5 cm
irregular hypoechoic area/mass with surrounding echogenicity at the
[DATE] position of the LEFT breast 5 cm from the nipple.

A single abnormal LOWER LEFT axillary lymph node with focal cortical
thickening of 4 mm is noted.
IMPRESSION: 1. Indeterminate 0.6 cm irregular hypoechoic area/mass in the
UPPER-OUTER LEFT breast with 1 abnormal LEFT axillary lymph node
with focal cortical thickening. Tissue sampling of both of these
areas recommended.

RECOMMENDATION:
1. Ultrasound-guided biopsy of 0.6 cm UPPER-OUTER LEFT breast mass
and abnormal LEFT axillary lymph node, which will be scheduled.

I have discussed the findings and recommendations with the patient.
If applicable, a reminder letter will be sent to the patient
regarding the next appointment.

BI-RADS CATEGORY  4: Suspicious.

## 2020-10-01 DIAGNOSIS — E538 Deficiency of other specified B group vitamins: Secondary | ICD-10-CM | POA: Diagnosis not present

## 2020-10-05 DIAGNOSIS — R0981 Nasal congestion: Secondary | ICD-10-CM | POA: Diagnosis not present

## 2020-10-05 DIAGNOSIS — J0100 Acute maxillary sinusitis, unspecified: Secondary | ICD-10-CM | POA: Diagnosis not present

## 2020-11-03 DIAGNOSIS — Z6830 Body mass index (BMI) 30.0-30.9, adult: Secondary | ICD-10-CM | POA: Diagnosis not present

## 2020-11-03 DIAGNOSIS — E1143 Type 2 diabetes mellitus with diabetic autonomic (poly)neuropathy: Secondary | ICD-10-CM | POA: Diagnosis not present

## 2020-11-03 DIAGNOSIS — E785 Hyperlipidemia, unspecified: Secondary | ICD-10-CM | POA: Diagnosis not present

## 2020-11-03 DIAGNOSIS — E538 Deficiency of other specified B group vitamins: Secondary | ICD-10-CM | POA: Diagnosis not present

## 2020-11-03 DIAGNOSIS — M810 Age-related osteoporosis without current pathological fracture: Secondary | ICD-10-CM | POA: Diagnosis not present

## 2020-11-03 DIAGNOSIS — I10 Essential (primary) hypertension: Secondary | ICD-10-CM | POA: Diagnosis not present

## 2020-11-03 DIAGNOSIS — E559 Vitamin D deficiency, unspecified: Secondary | ICD-10-CM | POA: Diagnosis not present

## 2020-12-01 DIAGNOSIS — F331 Major depressive disorder, recurrent, moderate: Secondary | ICD-10-CM | POA: Diagnosis not present

## 2020-12-01 DIAGNOSIS — R69 Illness, unspecified: Secondary | ICD-10-CM | POA: Diagnosis not present

## 2020-12-02 DIAGNOSIS — H47233 Glaucomatous optic atrophy, bilateral: Secondary | ICD-10-CM | POA: Diagnosis not present

## 2020-12-02 DIAGNOSIS — H401132 Primary open-angle glaucoma, bilateral, moderate stage: Secondary | ICD-10-CM | POA: Diagnosis not present

## 2020-12-02 DIAGNOSIS — H524 Presbyopia: Secondary | ICD-10-CM | POA: Diagnosis not present

## 2020-12-02 DIAGNOSIS — Z961 Presence of intraocular lens: Secondary | ICD-10-CM | POA: Diagnosis not present

## 2020-12-02 DIAGNOSIS — Z9849 Cataract extraction status, unspecified eye: Secondary | ICD-10-CM | POA: Diagnosis not present

## 2020-12-02 DIAGNOSIS — E119 Type 2 diabetes mellitus without complications: Secondary | ICD-10-CM | POA: Diagnosis not present

## 2020-12-02 DIAGNOSIS — Z7984 Long term (current) use of oral hypoglycemic drugs: Secondary | ICD-10-CM | POA: Diagnosis not present

## 2020-12-02 DIAGNOSIS — H5201 Hypermetropia, right eye: Secondary | ICD-10-CM | POA: Diagnosis not present

## 2020-12-02 DIAGNOSIS — H52223 Regular astigmatism, bilateral: Secondary | ICD-10-CM | POA: Diagnosis not present

## 2020-12-02 DIAGNOSIS — H35372 Puckering of macula, left eye: Secondary | ICD-10-CM | POA: Diagnosis not present

## 2020-12-04 DIAGNOSIS — E538 Deficiency of other specified B group vitamins: Secondary | ICD-10-CM | POA: Diagnosis not present

## 2020-12-15 DIAGNOSIS — J209 Acute bronchitis, unspecified: Secondary | ICD-10-CM | POA: Diagnosis not present

## 2020-12-15 DIAGNOSIS — Z20828 Contact with and (suspected) exposure to other viral communicable diseases: Secondary | ICD-10-CM | POA: Diagnosis not present

## 2020-12-15 DIAGNOSIS — R0981 Nasal congestion: Secondary | ICD-10-CM | POA: Diagnosis not present

## 2020-12-15 DIAGNOSIS — R051 Acute cough: Secondary | ICD-10-CM | POA: Diagnosis not present

## 2021-01-06 DIAGNOSIS — E538 Deficiency of other specified B group vitamins: Secondary | ICD-10-CM | POA: Diagnosis not present

## 2021-01-18 DIAGNOSIS — Z139 Encounter for screening, unspecified: Secondary | ICD-10-CM | POA: Diagnosis not present

## 2021-01-18 DIAGNOSIS — Z9181 History of falling: Secondary | ICD-10-CM | POA: Diagnosis not present

## 2021-01-18 DIAGNOSIS — Z0000 Encounter for general adult medical examination without abnormal findings: Secondary | ICD-10-CM | POA: Diagnosis not present

## 2021-01-18 DIAGNOSIS — Z1331 Encounter for screening for depression: Secondary | ICD-10-CM | POA: Diagnosis not present

## 2021-01-18 DIAGNOSIS — E785 Hyperlipidemia, unspecified: Secondary | ICD-10-CM | POA: Diagnosis not present

## 2021-02-09 DIAGNOSIS — E538 Deficiency of other specified B group vitamins: Secondary | ICD-10-CM | POA: Diagnosis not present

## 2021-02-23 DIAGNOSIS — F331 Major depressive disorder, recurrent, moderate: Secondary | ICD-10-CM | POA: Diagnosis not present

## 2021-02-23 DIAGNOSIS — R69 Illness, unspecified: Secondary | ICD-10-CM | POA: Diagnosis not present

## 2021-03-03 DIAGNOSIS — G629 Polyneuropathy, unspecified: Secondary | ICD-10-CM | POA: Diagnosis not present

## 2021-03-05 DIAGNOSIS — I10 Essential (primary) hypertension: Secondary | ICD-10-CM | POA: Diagnosis not present

## 2021-03-05 DIAGNOSIS — Z6830 Body mass index (BMI) 30.0-30.9, adult: Secondary | ICD-10-CM | POA: Diagnosis not present

## 2021-03-05 DIAGNOSIS — E559 Vitamin D deficiency, unspecified: Secondary | ICD-10-CM | POA: Diagnosis not present

## 2021-03-05 DIAGNOSIS — E785 Hyperlipidemia, unspecified: Secondary | ICD-10-CM | POA: Diagnosis not present

## 2021-03-05 DIAGNOSIS — M810 Age-related osteoporosis without current pathological fracture: Secondary | ICD-10-CM | POA: Diagnosis not present

## 2021-03-05 DIAGNOSIS — E538 Deficiency of other specified B group vitamins: Secondary | ICD-10-CM | POA: Diagnosis not present

## 2021-03-05 DIAGNOSIS — E1143 Type 2 diabetes mellitus with diabetic autonomic (poly)neuropathy: Secondary | ICD-10-CM | POA: Diagnosis not present

## 2021-03-08 DIAGNOSIS — H47233 Glaucomatous optic atrophy, bilateral: Secondary | ICD-10-CM | POA: Diagnosis not present

## 2021-03-08 DIAGNOSIS — H401132 Primary open-angle glaucoma, bilateral, moderate stage: Secondary | ICD-10-CM | POA: Diagnosis not present

## 2021-03-16 DIAGNOSIS — E538 Deficiency of other specified B group vitamins: Secondary | ICD-10-CM | POA: Diagnosis not present

## 2021-03-20 DIAGNOSIS — R0981 Nasal congestion: Secondary | ICD-10-CM | POA: Diagnosis not present

## 2021-03-20 DIAGNOSIS — R509 Fever, unspecified: Secondary | ICD-10-CM | POA: Diagnosis not present

## 2021-03-20 DIAGNOSIS — R519 Headache, unspecified: Secondary | ICD-10-CM | POA: Diagnosis not present

## 2021-03-20 DIAGNOSIS — Z20828 Contact with and (suspected) exposure to other viral communicable diseases: Secondary | ICD-10-CM | POA: Diagnosis not present

## 2021-04-20 DIAGNOSIS — E538 Deficiency of other specified B group vitamins: Secondary | ICD-10-CM | POA: Diagnosis not present

## 2021-04-23 ENCOUNTER — Other Ambulatory Visit: Payer: Self-pay | Admitting: Physician Assistant

## 2021-04-23 DIAGNOSIS — Z1231 Encounter for screening mammogram for malignant neoplasm of breast: Secondary | ICD-10-CM

## 2021-04-27 DIAGNOSIS — F331 Major depressive disorder, recurrent, moderate: Secondary | ICD-10-CM | POA: Diagnosis not present

## 2021-04-27 DIAGNOSIS — R69 Illness, unspecified: Secondary | ICD-10-CM | POA: Diagnosis not present

## 2021-05-20 ENCOUNTER — Ambulatory Visit
Admission: RE | Admit: 2021-05-20 | Discharge: 2021-05-20 | Disposition: A | Discharge status: Home / Self Care | Payer: Medicare HMO | Source: Ambulatory Visit | Attending: Physician Assistant | Admitting: Physician Assistant

## 2021-05-20 DIAGNOSIS — Z1231 Encounter for screening mammogram for malignant neoplasm of breast: Secondary | ICD-10-CM

## 2021-05-25 DIAGNOSIS — Z23 Encounter for immunization: Secondary | ICD-10-CM | POA: Diagnosis not present

## 2021-05-25 DIAGNOSIS — D519 Vitamin B12 deficiency anemia, unspecified: Secondary | ICD-10-CM | POA: Diagnosis not present

## 2021-06-30 DIAGNOSIS — E538 Deficiency of other specified B group vitamins: Secondary | ICD-10-CM | POA: Diagnosis not present

## 2021-06-30 DIAGNOSIS — J329 Chronic sinusitis, unspecified: Secondary | ICD-10-CM | POA: Diagnosis not present

## 2021-06-30 DIAGNOSIS — E559 Vitamin D deficiency, unspecified: Secondary | ICD-10-CM | POA: Diagnosis not present

## 2021-06-30 DIAGNOSIS — R2689 Other abnormalities of gait and mobility: Secondary | ICD-10-CM | POA: Diagnosis not present

## 2021-06-30 DIAGNOSIS — M6530 Trigger finger, unspecified finger: Secondary | ICD-10-CM | POA: Diagnosis not present

## 2021-06-30 DIAGNOSIS — M810 Age-related osteoporosis without current pathological fracture: Secondary | ICD-10-CM | POA: Diagnosis not present

## 2021-06-30 DIAGNOSIS — G629 Polyneuropathy, unspecified: Secondary | ICD-10-CM | POA: Diagnosis not present

## 2021-06-30 DIAGNOSIS — I10 Essential (primary) hypertension: Secondary | ICD-10-CM | POA: Diagnosis not present

## 2021-06-30 DIAGNOSIS — E1143 Type 2 diabetes mellitus with diabetic autonomic (poly)neuropathy: Secondary | ICD-10-CM | POA: Diagnosis not present

## 2021-06-30 DIAGNOSIS — E785 Hyperlipidemia, unspecified: Secondary | ICD-10-CM | POA: Diagnosis not present

## 2021-07-09 DIAGNOSIS — M65332 Trigger finger, left middle finger: Secondary | ICD-10-CM | POA: Diagnosis not present

## 2021-07-27 DIAGNOSIS — R69 Illness, unspecified: Secondary | ICD-10-CM | POA: Diagnosis not present

## 2021-07-27 DIAGNOSIS — F331 Major depressive disorder, recurrent, moderate: Secondary | ICD-10-CM | POA: Diagnosis not present

## 2021-07-27 DIAGNOSIS — F411 Generalized anxiety disorder: Secondary | ICD-10-CM | POA: Diagnosis not present

## 2021-08-03 DIAGNOSIS — D519 Vitamin B12 deficiency anemia, unspecified: Secondary | ICD-10-CM | POA: Diagnosis not present

## 2021-09-03 DIAGNOSIS — D519 Vitamin B12 deficiency anemia, unspecified: Secondary | ICD-10-CM | POA: Diagnosis not present

## 2021-09-07 DIAGNOSIS — H47233 Glaucomatous optic atrophy, bilateral: Secondary | ICD-10-CM | POA: Diagnosis not present

## 2021-09-07 DIAGNOSIS — H401132 Primary open-angle glaucoma, bilateral, moderate stage: Secondary | ICD-10-CM | POA: Diagnosis not present

## 2021-09-07 DIAGNOSIS — Z0100 Encounter for examination of eyes and vision without abnormal findings: Secondary | ICD-10-CM | POA: Diagnosis not present

## 2021-10-05 DIAGNOSIS — E538 Deficiency of other specified B group vitamins: Secondary | ICD-10-CM | POA: Diagnosis not present

## 2021-10-26 DIAGNOSIS — F411 Generalized anxiety disorder: Secondary | ICD-10-CM | POA: Diagnosis not present

## 2021-10-26 DIAGNOSIS — F331 Major depressive disorder, recurrent, moderate: Secondary | ICD-10-CM | POA: Diagnosis not present

## 2021-10-26 DIAGNOSIS — R69 Illness, unspecified: Secondary | ICD-10-CM | POA: Diagnosis not present

## 2021-11-09 DIAGNOSIS — I10 Essential (primary) hypertension: Secondary | ICD-10-CM | POA: Diagnosis not present

## 2021-11-09 DIAGNOSIS — G629 Polyneuropathy, unspecified: Secondary | ICD-10-CM | POA: Diagnosis not present

## 2021-11-09 DIAGNOSIS — E559 Vitamin D deficiency, unspecified: Secondary | ICD-10-CM | POA: Diagnosis not present

## 2021-11-09 DIAGNOSIS — R2689 Other abnormalities of gait and mobility: Secondary | ICD-10-CM | POA: Diagnosis not present

## 2021-11-09 DIAGNOSIS — M810 Age-related osteoporosis without current pathological fracture: Secondary | ICD-10-CM | POA: Diagnosis not present

## 2021-11-09 DIAGNOSIS — E1143 Type 2 diabetes mellitus with diabetic autonomic (poly)neuropathy: Secondary | ICD-10-CM | POA: Diagnosis not present

## 2021-11-09 DIAGNOSIS — Z6831 Body mass index (BMI) 31.0-31.9, adult: Secondary | ICD-10-CM | POA: Diagnosis not present

## 2021-11-09 DIAGNOSIS — E785 Hyperlipidemia, unspecified: Secondary | ICD-10-CM | POA: Diagnosis not present

## 2021-11-09 DIAGNOSIS — E538 Deficiency of other specified B group vitamins: Secondary | ICD-10-CM | POA: Diagnosis not present

## 2021-12-14 DIAGNOSIS — E538 Deficiency of other specified B group vitamins: Secondary | ICD-10-CM | POA: Diagnosis not present

## 2021-12-22 DIAGNOSIS — H401132 Primary open-angle glaucoma, bilateral, moderate stage: Secondary | ICD-10-CM | POA: Diagnosis not present

## 2021-12-22 DIAGNOSIS — H47233 Glaucomatous optic atrophy, bilateral: Secondary | ICD-10-CM | POA: Diagnosis not present

## 2022-01-13 DIAGNOSIS — E538 Deficiency of other specified B group vitamins: Secondary | ICD-10-CM | POA: Diagnosis not present

## 2022-01-25 DIAGNOSIS — F411 Generalized anxiety disorder: Secondary | ICD-10-CM | POA: Diagnosis not present

## 2022-01-25 DIAGNOSIS — R69 Illness, unspecified: Secondary | ICD-10-CM | POA: Diagnosis not present

## 2022-01-25 DIAGNOSIS — F331 Major depressive disorder, recurrent, moderate: Secondary | ICD-10-CM | POA: Diagnosis not present

## 2022-02-14 DIAGNOSIS — E538 Deficiency of other specified B group vitamins: Secondary | ICD-10-CM | POA: Diagnosis not present

## 2022-02-16 DIAGNOSIS — E785 Hyperlipidemia, unspecified: Secondary | ICD-10-CM | POA: Diagnosis not present

## 2022-02-16 DIAGNOSIS — Z0000 Encounter for general adult medical examination without abnormal findings: Secondary | ICD-10-CM | POA: Diagnosis not present

## 2022-02-16 DIAGNOSIS — Z6831 Body mass index (BMI) 31.0-31.9, adult: Secondary | ICD-10-CM | POA: Diagnosis not present

## 2022-02-16 DIAGNOSIS — Z139 Encounter for screening, unspecified: Secondary | ICD-10-CM | POA: Diagnosis not present

## 2022-02-16 DIAGNOSIS — E669 Obesity, unspecified: Secondary | ICD-10-CM | POA: Diagnosis not present

## 2022-02-16 DIAGNOSIS — Z9181 History of falling: Secondary | ICD-10-CM | POA: Diagnosis not present

## 2022-02-16 DIAGNOSIS — Z1331 Encounter for screening for depression: Secondary | ICD-10-CM | POA: Diagnosis not present

## 2022-03-03 DIAGNOSIS — G629 Polyneuropathy, unspecified: Secondary | ICD-10-CM | POA: Diagnosis not present

## 2022-03-23 DIAGNOSIS — E559 Vitamin D deficiency, unspecified: Secondary | ICD-10-CM | POA: Diagnosis not present

## 2022-03-23 DIAGNOSIS — E785 Hyperlipidemia, unspecified: Secondary | ICD-10-CM | POA: Diagnosis not present

## 2022-03-23 DIAGNOSIS — E1143 Type 2 diabetes mellitus with diabetic autonomic (poly)neuropathy: Secondary | ICD-10-CM | POA: Diagnosis not present

## 2022-03-23 DIAGNOSIS — G629 Polyneuropathy, unspecified: Secondary | ICD-10-CM | POA: Diagnosis not present

## 2022-03-23 DIAGNOSIS — Z1231 Encounter for screening mammogram for malignant neoplasm of breast: Secondary | ICD-10-CM | POA: Diagnosis not present

## 2022-03-23 DIAGNOSIS — E538 Deficiency of other specified B group vitamins: Secondary | ICD-10-CM | POA: Diagnosis not present

## 2022-03-23 DIAGNOSIS — Z6832 Body mass index (BMI) 32.0-32.9, adult: Secondary | ICD-10-CM | POA: Diagnosis not present

## 2022-03-29 DIAGNOSIS — Z961 Presence of intraocular lens: Secondary | ICD-10-CM | POA: Diagnosis not present

## 2022-03-29 DIAGNOSIS — E119 Type 2 diabetes mellitus without complications: Secondary | ICD-10-CM | POA: Diagnosis not present

## 2022-03-29 DIAGNOSIS — H401132 Primary open-angle glaucoma, bilateral, moderate stage: Secondary | ICD-10-CM | POA: Diagnosis not present

## 2022-03-29 DIAGNOSIS — H26493 Other secondary cataract, bilateral: Secondary | ICD-10-CM | POA: Diagnosis not present

## 2022-03-29 DIAGNOSIS — H5203 Hypermetropia, bilateral: Secondary | ICD-10-CM | POA: Diagnosis not present

## 2022-03-29 DIAGNOSIS — H524 Presbyopia: Secondary | ICD-10-CM | POA: Diagnosis not present

## 2022-03-29 DIAGNOSIS — H47233 Glaucomatous optic atrophy, bilateral: Secondary | ICD-10-CM | POA: Diagnosis not present

## 2022-03-29 DIAGNOSIS — Z7984 Long term (current) use of oral hypoglycemic drugs: Secondary | ICD-10-CM | POA: Diagnosis not present

## 2022-03-29 DIAGNOSIS — H52223 Regular astigmatism, bilateral: Secondary | ICD-10-CM | POA: Diagnosis not present

## 2022-04-26 DIAGNOSIS — R69 Illness, unspecified: Secondary | ICD-10-CM | POA: Diagnosis not present

## 2022-04-26 DIAGNOSIS — F331 Major depressive disorder, recurrent, moderate: Secondary | ICD-10-CM | POA: Diagnosis not present

## 2022-04-26 DIAGNOSIS — F411 Generalized anxiety disorder: Secondary | ICD-10-CM | POA: Diagnosis not present

## 2022-05-22 DIAGNOSIS — J209 Acute bronchitis, unspecified: Secondary | ICD-10-CM | POA: Diagnosis not present

## 2022-05-22 DIAGNOSIS — R059 Cough, unspecified: Secondary | ICD-10-CM | POA: Diagnosis not present

## 2022-05-22 DIAGNOSIS — J019 Acute sinusitis, unspecified: Secondary | ICD-10-CM | POA: Diagnosis not present

## 2022-05-22 DIAGNOSIS — R0981 Nasal congestion: Secondary | ICD-10-CM | POA: Diagnosis not present

## 2022-05-24 DIAGNOSIS — E538 Deficiency of other specified B group vitamins: Secondary | ICD-10-CM | POA: Diagnosis not present

## 2022-06-07 DIAGNOSIS — J209 Acute bronchitis, unspecified: Secondary | ICD-10-CM | POA: Diagnosis not present

## 2022-07-01 DIAGNOSIS — H47233 Glaucomatous optic atrophy, bilateral: Secondary | ICD-10-CM | POA: Diagnosis not present

## 2022-07-01 DIAGNOSIS — H4010X2 Unspecified open-angle glaucoma, moderate stage: Secondary | ICD-10-CM | POA: Diagnosis not present

## 2022-07-01 DIAGNOSIS — H401132 Primary open-angle glaucoma, bilateral, moderate stage: Secondary | ICD-10-CM | POA: Diagnosis not present

## 2022-07-10 DIAGNOSIS — J209 Acute bronchitis, unspecified: Secondary | ICD-10-CM | POA: Diagnosis not present

## 2022-07-20 DIAGNOSIS — R69 Illness, unspecified: Secondary | ICD-10-CM | POA: Diagnosis not present

## 2022-07-20 DIAGNOSIS — F331 Major depressive disorder, recurrent, moderate: Secondary | ICD-10-CM | POA: Diagnosis not present

## 2022-07-20 DIAGNOSIS — F411 Generalized anxiety disorder: Secondary | ICD-10-CM | POA: Diagnosis not present

## 2022-07-26 DIAGNOSIS — E559 Vitamin D deficiency, unspecified: Secondary | ICD-10-CM | POA: Diagnosis not present

## 2022-07-26 DIAGNOSIS — E785 Hyperlipidemia, unspecified: Secondary | ICD-10-CM | POA: Diagnosis not present

## 2022-07-26 DIAGNOSIS — Z23 Encounter for immunization: Secondary | ICD-10-CM | POA: Diagnosis not present

## 2022-07-26 DIAGNOSIS — R531 Weakness: Secondary | ICD-10-CM | POA: Diagnosis not present

## 2022-07-26 DIAGNOSIS — E538 Deficiency of other specified B group vitamins: Secondary | ICD-10-CM | POA: Diagnosis not present

## 2022-07-26 DIAGNOSIS — Z6832 Body mass index (BMI) 32.0-32.9, adult: Secondary | ICD-10-CM | POA: Diagnosis not present

## 2022-07-26 DIAGNOSIS — E1143 Type 2 diabetes mellitus with diabetic autonomic (poly)neuropathy: Secondary | ICD-10-CM | POA: Diagnosis not present

## 2022-07-26 DIAGNOSIS — I10 Essential (primary) hypertension: Secondary | ICD-10-CM | POA: Diagnosis not present

## 2022-07-26 DIAGNOSIS — M810 Age-related osteoporosis without current pathological fracture: Secondary | ICD-10-CM | POA: Diagnosis not present

## 2022-07-26 DIAGNOSIS — R2689 Other abnormalities of gait and mobility: Secondary | ICD-10-CM | POA: Diagnosis not present

## 2022-07-27 ENCOUNTER — Other Ambulatory Visit: Payer: Self-pay | Admitting: Adult Health

## 2022-07-27 DIAGNOSIS — M810 Age-related osteoporosis without current pathological fracture: Secondary | ICD-10-CM

## 2022-07-27 DIAGNOSIS — Z1231 Encounter for screening mammogram for malignant neoplasm of breast: Secondary | ICD-10-CM

## 2022-08-17 ENCOUNTER — Ambulatory Visit
Admission: RE | Admit: 2022-08-17 | Discharge: 2022-08-17 | Disposition: A | Discharge status: Home / Self Care | Payer: Medicare HMO | Source: Ambulatory Visit | Attending: Adult Health | Admitting: Adult Health

## 2022-08-17 DIAGNOSIS — Z780 Asymptomatic menopausal state: Secondary | ICD-10-CM | POA: Diagnosis not present

## 2022-08-17 DIAGNOSIS — M810 Age-related osteoporosis without current pathological fracture: Secondary | ICD-10-CM

## 2022-08-17 DIAGNOSIS — M85852 Other specified disorders of bone density and structure, left thigh: Secondary | ICD-10-CM | POA: Diagnosis not present

## 2022-08-17 DIAGNOSIS — Z1231 Encounter for screening mammogram for malignant neoplasm of breast: Secondary | ICD-10-CM | POA: Diagnosis not present

## 2022-08-17 DIAGNOSIS — D72829 Elevated white blood cell count, unspecified: Secondary | ICD-10-CM | POA: Diagnosis not present

## 2022-08-23 ENCOUNTER — Other Ambulatory Visit: Payer: Self-pay | Admitting: Hematology and Oncology

## 2022-08-23 DIAGNOSIS — D72829 Elevated white blood cell count, unspecified: Secondary | ICD-10-CM

## 2022-08-23 DIAGNOSIS — D509 Iron deficiency anemia, unspecified: Secondary | ICD-10-CM

## 2022-08-23 NOTE — Progress Notes (Addendum)
[facility] [address] [place],  [place]  [address] [phone]  Clinic Day:  08/24/2022  Referring physician: Marlyn Corporal, PA   REASON FOR CONSULTATION:  Leukocytosis  HISTORY OF PRESENT ILLNESS:  Jodi Nelson is a 82 y.o. female with leukocytosis who is referred in consultation by Mikki Santee, NP for assessment and management.  CBC on February 7 revealed a white blood count of 14.2 with a normal differential.  Her hemoglobin was hemoglobin 10.8 with an MCV of 72 and platelets 504,000.  These findings are consistent with iron deficiency anemia.  CBC in January revealed white count 12.2 with a normal differential, hemoglobin 11.5 with an MCV of 71 and platelets 403,000.  She has a history of B12 deficiency, for which she is on vitamin B12 injections every other month.  Of note, patient was seen in 2014 by Dr. Melvyn Neth for mild leukocytosis, which was felt to be benign in nature.  The patient reports having bronchitis in November, December and January.  She was last treated with steroids in December and antibiotics in January.  She denies any current signs of infection, including urinary symptoms.  She reports chronic mild fatigue, which she attributes to neuropathy.  She denies progressive fatigue.  She denies fever, chills or night sweats.  She denies unintentional weight loss.  She denies any overt form of bleeding.  She is on aspirin 81 mg daily and reports easy bruising.  Her last EGD and colonoscopy were done by Dr. Jennye Boroughs in 2013.  EGD and H. pylori was negative.  Colonoscopy revealed mild colonic diverticular disease and scattered right colonic diverticula.  She had removal of a small hyperplastic polyp.  She has not been to see Dr. Jennye Boroughs since.  Her pertinent medical history includes diabetes mellitus, diabetic neuropathy, hypertension and hyperlipidemia.  She has a history of breast biopsies and lumpectomy for benign disease.  She  states she had a mammogram and bone density scan last week.  She has a family history of mother with colon cancer in her 62s.  Her father had prostate cancer in his 60s.  Her paternal grandmother had ovarian cancer over the age of 44.  Her sister had breast cancer at age 51.   REVIEW OF SYSTEMS:  Review of Systems  Constitutional:  Positive for fatigue. Negative for appetite change, chills, fever and unexpected weight change.  HENT:   Negative for lump/mass, mouth sores and sore throat.   Respiratory:  Negative for cough and shortness of breath.   Cardiovascular:  Negative for chest pain and leg swelling.  Gastrointestinal:  Positive for diarrhea (Weekly episodes of diarrhea). Negative for abdominal pain, constipation, nausea and vomiting.  Endocrine: Negative for hot flashes.  Genitourinary:  Negative for difficulty urinating, dysuria, frequency and hematuria.   Musculoskeletal:  Negative for arthralgias, back pain and myalgias.  Skin:  Negative for rash.  Neurological:  Positive for numbness (Bilateral lower extremities). Negative for dizziness and headaches.  Hematological:  Negative for adenopathy. Bruises/bleeds easily (Easy bruising, no abnormal bleeding).  Psychiatric/Behavioral:  Negative for depression and sleep disturbance. The patient is not nervous/anxious.      VITALS:  Blood pressure (!) 142/57, pulse 82, temperature 98 F (36.7 C), temperature source Oral, resp. rate 18, height 5' (1.524 m), weight 161 lb 4.8 oz (73.2 kg), SpO2 100 %.  Wt Readings from Last 3 Encounters:  08/24/22 161 lb 4.8 oz (73.2 kg)    Body mass index is 31.5 kg/m.  Performance status (ECOG): 2 - Symptomatic, <50% confined to bed  PHYSICAL EXAM:  Physical Exam Vitals and nursing note reviewed.  Constitutional:      General: She is not in acute distress.    Appearance: Normal appearance.     Comments: Mildly weak appearing woman who ambulates with a walker.  She is unable to get up on the  examining table today.  HENT:     Head: Normocephalic and atraumatic.     Mouth/Throat:     Pharynx: Oropharynx is clear. No oropharyngeal exudate or posterior oropharyngeal erythema.     Comments: Tonsils are surgically absent. Eyes:     General: No scleral icterus.    Extraocular Movements: Extraocular movements intact.     Conjunctiva/sclera: Conjunctivae normal.     Pupils: Pupils are equal, round, and reactive to light.  Cardiovascular:     Rate and Rhythm: Normal rate and regular rhythm.     Heart sounds: Normal heart sounds. No murmur heard.    No friction rub. No gallop.  Pulmonary:     Effort: Pulmonary effort is normal.     Breath sounds: Normal breath sounds. No wheezing, rhonchi or rales.  Abdominal:     General: There is no distension.     Palpations: Abdomen is soft. There is no hepatomegaly, splenomegaly or mass.     Tenderness: There is no abdominal tenderness.  Musculoskeletal:        General: Normal range of motion.     Cervical back: Normal range of motion and neck supple. No tenderness.     Right lower leg: No edema.     Left lower leg: No edema.  Lymphadenopathy:     Cervical: No cervical adenopathy.     Upper Body:     Right upper body: No supraclavicular or axillary adenopathy.     Left upper body: No supraclavicular or axillary adenopathy.     Lower Body: No right inguinal adenopathy. No left inguinal adenopathy.  Skin:    General: Skin is warm and dry.     Coloration: Skin is not jaundiced.     Findings: No rash.  Neurological:     Mental Status: She is alert and oriented to person, place, and time.     Cranial Nerves: No cranial nerve deficit.  Psychiatric:        Mood and Affect: Mood normal.        Behavior: Behavior normal.        Thought Content: Thought content normal.      LABS:      Latest Ref Rng & Units 08/24/2022   10:06 AM  CBC  WBC 4.0 - 10.5 K/uL 14.3   Hemoglobin 12.0 - 15.0 g/dL 84.1   Hematocrit 32.4 - 46.0 % 36.2    Platelets 150 - 400 K/uL 424       Latest Ref Rng & Units 08/24/2022   10:06 AM  CMP  Glucose 70 - 99 mg/dL 401   BUN 8 - 23 mg/dL 15   Creatinine 0.27 - 1.00 mg/dL 2.53   Sodium 664 - 403 mmol/L 138   Potassium 3.5 - 5.1 mmol/L 3.9   Chloride 98 - 111 mmol/L 103   CO2 22 - 32 mmol/L 25   Calcium 8.9 - 10.3 mg/dL 8.8   Total Protein 6.5 - 8.1 g/dL 6.3   Total Bilirubin 0.3 - 1.2 mg/dL <4.7   Alkaline Phos 38 - 126 U/L 66   AST 15 - 41  U/L 19   ALT 0 - 44 U/L 19      No results found for: "CEA1", "CEA" / No results found for: "CEA1", "CEA" No results found for: "PSA1" No results found for: "MVH846" No results found for: "CAN125"  No results found for: "TOTALPROTELP", "ALBUMINELP", "A1GS", "A2GS", "BETS", "BETA2SER", "GAMS", "MSPIKE", "SPEI" Lab Results  Component Value Date   TIBC 461 (H) 08/24/2022   FERRITIN 7 (L) 08/24/2022   IRONPCTSAT 8 (L) 08/24/2022   No results found for: "LDH"  STUDIES:  MM 3D SCREEN BREAST BILATERAL  Result Date: 08/19/2022 CLINICAL DATA:  Screening. EXAM: DIGITAL SCREENING BILATERAL MAMMOGRAM WITH TOMOSYNTHESIS AND CAD TECHNIQUE: Bilateral screening digital craniocaudal and mediolateral oblique mammograms were obtained. Bilateral screening digital breast tomosynthesis was performed. The images were evaluated with computer-aided detection. COMPARISON:  Previous exam(s). ACR Breast Density Category a: The breast tissue is almost entirely fatty. FINDINGS: There are no findings suspicious for malignancy. IMPRESSION: No mammographic evidence of malignancy. A result letter of this screening mammogram will be mailed directly to the patient. RECOMMENDATION: Screening mammogram in one year. (Code:SM-B-01Y) BI-RADS CATEGORY  1: Negative. Electronically Signed   By: Elberta Fortis M.D.   On: 08/19/2022 12:34   DG MOBILE BONE DENSITY  Result Date: 08/18/2022 EXAM: DUAL X-RAY ABSORPTIOMETRY (DXA) FOR BONE MINERAL DENSITY 08/17/2022 11:10 am CLINICAL DATA:  82 year  old Female Postmenopausal. OSTEOPOROSIS TECHNIQUE: An axial (e.g., hips, spine) and/or appendicular (e.g., radius) exam was performed, as appropriate, using Haematologist at [facility]. Images are obtained for bone mineral density measurement and are not obtained for diagnostic purposes. [id number] Exclusions: Forearm due to patient unsteady and unable to sit on stool. COMPARISON:  None. New baseline. FINDINGS: Scan quality: Good. LUMBAR SPINE (L1-L4): BMD (in g/cm2): 1.055 T-score: 0.1 Z-score: 2.8 LEFT FEMORAL NECK: BMD (in g/cm2): 0.610 T-score: -2.2 Z-score: 0.2 LEFT TOTAL HIP: BMD (in g/cm2): 0.802 T-score: -1.1 Z-score: 0.9 FRAX 10-YEAR PROBABILITY OF FRACTURE: 10-year fracture risk is performed using the [employer] FRAX calculator based on patient-reported risk factors. Major osteoporotic fracture: 15% Hip fracture: 4.3% Other situations known to alter the reliability of the FRAX score should be considered when making treatment decisions, including chronic glucocorticoid use and past treatments. Further guidance on treatment can be found at the [facility]oporosis Foundation's website https://www.patton.com/. IMPRESSION: Osteopenia based on BMD. Fracture risk is increased. Increased risk is based on low BMD, and FRAX calculation. RECOMMENDATIONS: 1. All patients should optimize calcium and vitamin D intake. 2. Consider FDA-approved medical therapies in postmenopausal women and men aged 55 years and older, based on the following: - A hip or vertebral (clinical or morphometric) fracture - T-score less than or equal to -2.5 and secondary causes have been excluded. - Low bone mass (T-score between -1.0 and -2.5) and a 10-year probability of a hip fracture greater than or equal to 3% or a 10-year probability of a major osteoporosis-related fracture greater than or equal to 20% based on the [place]-adapted WHO algorithm. - Clinician judgment and/or patient preferences may indicate  treatment for people with 10-year fracture probabilities above or below these levels 3. Patients with diagnosis of osteoporosis or at high risk for fracture should have regular bone mineral density tests. For patients eligible for Medicare, routine testing is allowed once every 2 years. The testing frequency can be increased to one year for patients who have rapidly progressing disease, those who are receiving or discontinuing medical therapy to restore bone mass, or have  additional risk factors. Electronically Signed   By: Romona Curls M.D.   On: 08/18/2022 08:12      HISTORY:   Past Medical History:  Diagnosis Date   Diabetes (HCC)    type two   Diabetic neuropathy (HCC)    Hyperlipidemia    Hypertension    Osteoporosis    Vitamin B 12 deficiency    Vitamin D deficiency     Past Surgical History:  Procedure Laterality Date   BLADDER SURGERY     BREAST BIOPSY Right 08/10/2011   BREAST BIOPSY Left 03/2019   clip placement   BREAST LUMPECTOMY Right    reported as benign   CATARACT EXTRACTION Bilateral    FOOT SURGERY Left    TONSILLECTOMY AND ADENOIDECTOMY      Family History  Problem Relation Age of Onset   Colon cancer Mother    Prostate cancer Father    Breast cancer Sister    Kidney disease Sister     Social History:  reports that she has never smoked. She has never used smokeless tobacco. She reports that she does not drink alcohol and does not use drugs.The patient is accompanied by her husband Ree Kida, and son Tammy Sours today.  She has 3 children, 2 daughters and a son.  She has 5 grandchildren, 1 granddaughter 4 grandsons.  She is retired from [employer].  Allergies:  Allergies  Allergen Reactions   Biaxin [Clarithromycin]    Doxycycline    Niaspan [Niacin]    Tetracyclines & Related    Zithromax [Azithromycin]    Erythromycin Base Rash   Penicillins Rash   Sulfa Antibiotics Rash    Current Medications: Current Outpatient Medications  Medication Sig Dispense  Refill   aspirin EC 81 MG tablet Take 81 mg by mouth daily. Swallow whole.     atorvastatin (LIPITOR) 80 MG tablet Take 80 mg by mouth daily.     co-enzyme Q-10 30 MG capsule Take 100 mg by mouth daily. 2 tabs  taking 200mg  daily     furosemide (LASIX) 20 MG tablet Take 20 mg by mouth every Monday, Wednesday, and Friday.     gabapentin (NEURONTIN) 600 MG tablet Take 600 mg by mouth in the morning, at noon, in the evening, and at bedtime.     lactobacillus (FLORANEX/LACTINEX) PACK Take 1 g by mouth 3 (three) times daily with meals.     LORazepam (ATIVAN) 1 MG tablet Take 1 mg by mouth every 6 (six) hours as needed for anxiety.     metFORMIN (GLUCOPHAGE) 1000 MG tablet Take 1,000 mg by mouth 2 (two) times daily with a meal.     nortriptyline (PAMELOR) 75 MG capsule Take 75 mg by mouth at bedtime.     Omega-3 1000 MG CAPS Take by mouth.     pioglitazone (ACTOS) 15 MG tablet Take 15 mg by mouth daily.     pregabalin (LYRICA) 75 MG capsule Take 75 mg by mouth at bedtime.     Probiotic Product (PROBIOTIC ADVANCED PO) Take by mouth.     timolol (TIMOPTIC) 0.5 % ophthalmic solution 1 drop 2 (two) times daily.     No current facility-administered medications for this visit.     ASSESSMENT & PLAN:   Assessment:  Jodi Nelson is a 82 y.o. female with mild leukocytosis, which she has had dating back to 2014.  She is not currently on any medications that would contribute to her leukocytosis.  This may be due to infection.  She could have potentially chronic myelogenous leukemia, but as this has not been significantly progressive, this is unlikely.  She also has microcytic anemia with associated thrombocytosis due to iron deficiency.  She has a family history of malignancy concerning for hereditary cancer syndrome.   Plan: 1.  Evaluate leukocytosis with BCR/ABL FISH and JAK2.  Will also check urinalysis. 2.  Evaluate anemia with ferritin, folate, B12, haptoglobin and multiple myeloma panel.  If  indeed she has iron deficiency as expected, she will need to see her gastroenterologist. 3.  I discussed seeing the genetic counselor, although testing would not likely have an impact on her care as she is unaffected.  I discussed the assessment and plan with the patient and her family.  They were provided an opportunity to ask questions and all were answered.  The patient agreed with the plan and demonstrated an understanding of the instructions.  The patient was advised to call back if she has symptoms of worsening anemia or symptoms of infection.  Thank you for the referral.   I provided 45 minutes of face-to-face time during this encounter and > 50% was spent counseling as documented under my assessment and plan.  The patient was staffed and plan formulated with Dr. Angelene Giovanni.    Kaily Wragg A Bart Ashford, PA-C    Addendum:    Urinalysis was suspicious, so urine culture will be performed.  Iron studies revealed iron deficiency.  We will arrange for her to have IV iron in the upcoming days.  We will have her see Dr. Jennye Boroughs for his recommendations for evaluation.

## 2022-08-24 ENCOUNTER — Other Ambulatory Visit: Payer: Self-pay | Admitting: Hematology and Oncology

## 2022-08-24 ENCOUNTER — Telehealth: Payer: Self-pay | Admitting: Hematology and Oncology

## 2022-08-24 ENCOUNTER — Inpatient Hospital Stay: Payer: Medicare HMO | Attending: Hematology and Oncology | Admitting: Hematology and Oncology

## 2022-08-24 ENCOUNTER — Inpatient Hospital Stay: Payer: Medicare HMO

## 2022-08-24 ENCOUNTER — Telehealth: Payer: Self-pay | Admitting: Oncology

## 2022-08-24 ENCOUNTER — Encounter: Payer: Self-pay | Admitting: Hematology and Oncology

## 2022-08-24 VITALS — BP 142/57 | HR 82 | Temp 98.0°F | Resp 18 | Ht 60.0 in | Wt 161.3 lb

## 2022-08-24 DIAGNOSIS — D509 Iron deficiency anemia, unspecified: Secondary | ICD-10-CM | POA: Diagnosis not present

## 2022-08-24 DIAGNOSIS — D72829 Elevated white blood cell count, unspecified: Secondary | ICD-10-CM | POA: Insufficient documentation

## 2022-08-24 DIAGNOSIS — Z79899 Other long term (current) drug therapy: Secondary | ICD-10-CM | POA: Insufficient documentation

## 2022-08-24 DIAGNOSIS — Z8042 Family history of malignant neoplasm of prostate: Secondary | ICD-10-CM | POA: Insufficient documentation

## 2022-08-24 DIAGNOSIS — I10 Essential (primary) hypertension: Secondary | ICD-10-CM | POA: Insufficient documentation

## 2022-08-24 DIAGNOSIS — E1140 Type 2 diabetes mellitus with diabetic neuropathy, unspecified: Secondary | ICD-10-CM | POA: Insufficient documentation

## 2022-08-24 DIAGNOSIS — Z8041 Family history of malignant neoplasm of ovary: Secondary | ICD-10-CM | POA: Diagnosis not present

## 2022-08-24 DIAGNOSIS — Z800 Family history of malignant neoplasm of digestive organs: Secondary | ICD-10-CM | POA: Insufficient documentation

## 2022-08-24 DIAGNOSIS — D500 Iron deficiency anemia secondary to blood loss (chronic): Secondary | ICD-10-CM | POA: Insufficient documentation

## 2022-08-24 DIAGNOSIS — Z803 Family history of malignant neoplasm of breast: Secondary | ICD-10-CM | POA: Diagnosis not present

## 2022-08-24 DIAGNOSIS — E538 Deficiency of other specified B group vitamins: Secondary | ICD-10-CM | POA: Insufficient documentation

## 2022-08-24 DIAGNOSIS — E1159 Type 2 diabetes mellitus with other circulatory complications: Secondary | ICD-10-CM | POA: Diagnosis not present

## 2022-08-24 LAB — URINALYSIS, COMPLETE (UACMP) WITH MICROSCOPIC
Bilirubin Urine: NEGATIVE
Glucose, UA: NEGATIVE mg/dL
Hgb urine dipstick: NEGATIVE
Ketones, ur: NEGATIVE mg/dL
Nitrite: POSITIVE — AB
Protein, ur: NEGATIVE mg/dL
Specific Gravity, Urine: 1.011 (ref 1.005–1.030)
WBC, UA: >50 WBC/hpf (ref 0–5)
pH: 6 (ref 5.0–8.0)

## 2022-08-24 LAB — CBC WITH DIFFERENTIAL (CANCER CENTER ONLY)
Abs Immature Granulocytes: 0.06 10*3/uL (ref 0.00–0.07)
Basophils Absolute: 0.1 10*3/uL (ref 0.0–0.1)
Basophils Relative: 0 %
Eosinophils Absolute: 0.3 10*3/uL (ref 0.0–0.5)
Eosinophils Relative: 2 %
HCT: 36.2 % (ref 36.0–46.0)
Hemoglobin: 11 g/dL — ABNORMAL LOW (ref 12.0–15.0)
Immature Granulocytes: 0 %
Lymphocytes Relative: 25 %
Lymphs Abs: 3.6 10*3/uL (ref 0.7–4.0)
MCH: 22.9 pg — ABNORMAL LOW (ref 26.0–34.0)
MCHC: 30.4 g/dL (ref 30.0–36.0)
MCV: 75.3 fL — ABNORMAL LOW (ref 80.0–100.0)
Monocytes Absolute: 0.9 10*3/uL (ref 0.1–1.0)
Monocytes Relative: 6 %
Neutro Abs: 9.4 10*3/uL — ABNORMAL HIGH (ref 1.7–7.7)
Neutrophils Relative %: 67 %
Platelet Count: 424 10*3/uL — ABNORMAL HIGH (ref 150–400)
RBC: 4.81 MIL/uL (ref 3.87–5.11)
RDW: 21.4 % — ABNORMAL HIGH (ref 11.5–15.5)
WBC Count: 14.3 10*3/uL — ABNORMAL HIGH (ref 4.0–10.5)
nRBC: 0 % (ref 0.0–0.2)

## 2022-08-24 LAB — CMP (CANCER CENTER ONLY)
ALT: 19 U/L (ref 0–44)
AST: 19 U/L (ref 15–41)
Albumin: 3 g/dL — ABNORMAL LOW (ref 3.5–5.0)
Alkaline Phosphatase: 66 U/L (ref 38–126)
Anion gap: 10 (ref 5–15)
BUN: 15 mg/dL (ref 8–23)
CO2: 25 mmol/L (ref 22–32)
Calcium: 8.8 mg/dL — ABNORMAL LOW (ref 8.9–10.3)
Chloride: 103 mmol/L (ref 98–111)
Creatinine: 0.75 mg/dL (ref 0.44–1.00)
GFR, Estimated: >60 mL/min (ref >60)
Glucose, Bld: 140 mg/dL — ABNORMAL HIGH (ref 70–99)
Potassium: 3.9 mmol/L (ref 3.5–5.1)
Sodium: 138 mmol/L (ref 135–145)
Total Bilirubin: <0.1 mg/dL — ABNORMAL LOW (ref 0.3–1.2)
Total Protein: 6.3 g/dL — ABNORMAL LOW (ref 6.5–8.1)

## 2022-08-24 LAB — IRON AND TIBC
Iron: 35 ug/dL (ref 28–170)
Saturation Ratios: 8 % — ABNORMAL LOW (ref 10.4–31.8)
TIBC: 461 ug/dL — ABNORMAL HIGH (ref 250–450)
UIBC: 426 ug/dL

## 2022-08-24 LAB — VITAMIN B12: Vitamin B-12: 291 pg/mL (ref 180–914)

## 2022-08-24 LAB — FOLATE: Folate: 15 ng/mL (ref >5.9)

## 2022-08-24 LAB — FERRITIN: Ferritin: 7 ng/mL — ABNORMAL LOW (ref 11–307)

## 2022-08-24 NOTE — Telephone Encounter (Signed)
Patient has been scheduled. Aware of appt dates and times.   Scheduling Message Entered by Rosanne Sack A on 08/24/2022 at  3:41 PM Priority: High <No visit type provided>  Department: [facility]  Provider:  Scheduling Notes:  Please schedule for IV Venofer for 5 doses, thank you

## 2022-08-24 NOTE — Telephone Encounter (Signed)
08/24/22 Next appt scheduled and confirmed with patient

## 2022-08-25 ENCOUNTER — Telehealth: Payer: Self-pay

## 2022-08-25 LAB — TECHNOLOGIST SMEAR REVIEW

## 2022-08-25 LAB — BCR-ABL1 FISH

## 2022-08-25 NOTE — Telephone Encounter (Signed)
-----   Message from Marvia Pickles, PA-C sent at 08/24/2022 11:40 AM EST ----- Please refer to Dr. Lyda Jester for iron deficiency (wait for labs to come back this pm). Thanks

## 2022-08-25 NOTE — Telephone Encounter (Signed)
Referral faxed.  

## 2022-08-25 NOTE — Addendum Note (Signed)
Addended by: Daryel November on: 08/25/2022 02:43 PM   Modules accepted: Orders

## 2022-08-26 ENCOUNTER — Inpatient Hospital Stay: Payer: Medicare HMO

## 2022-08-26 ENCOUNTER — Encounter: Payer: Self-pay | Admitting: Hematology and Oncology

## 2022-08-26 DIAGNOSIS — D509 Iron deficiency anemia, unspecified: Secondary | ICD-10-CM

## 2022-08-26 DIAGNOSIS — D72829 Elevated white blood cell count, unspecified: Secondary | ICD-10-CM

## 2022-08-26 LAB — HAPTOGLOBIN: Haptoglobin: 373 mg/dL — ABNORMAL HIGH (ref 42–346)

## 2022-08-26 MED FILL — Iron Sucrose Inj 20 MG/ML (Fe Equiv): INTRAVENOUS | Qty: 10 | Status: AC

## 2022-08-29 ENCOUNTER — Inpatient Hospital Stay: Payer: Medicare HMO

## 2022-08-29 VITALS — BP 127/66 | HR 73 | Temp 97.6°F | Resp 16

## 2022-08-29 DIAGNOSIS — Z803 Family history of malignant neoplasm of breast: Secondary | ICD-10-CM | POA: Diagnosis not present

## 2022-08-29 DIAGNOSIS — Z800 Family history of malignant neoplasm of digestive organs: Secondary | ICD-10-CM | POA: Diagnosis not present

## 2022-08-29 DIAGNOSIS — Z79899 Other long term (current) drug therapy: Secondary | ICD-10-CM | POA: Diagnosis not present

## 2022-08-29 DIAGNOSIS — I10 Essential (primary) hypertension: Secondary | ICD-10-CM | POA: Diagnosis not present

## 2022-08-29 DIAGNOSIS — Z8042 Family history of malignant neoplasm of prostate: Secondary | ICD-10-CM | POA: Diagnosis not present

## 2022-08-29 DIAGNOSIS — D500 Iron deficiency anemia secondary to blood loss (chronic): Secondary | ICD-10-CM

## 2022-08-29 DIAGNOSIS — E1140 Type 2 diabetes mellitus with diabetic neuropathy, unspecified: Secondary | ICD-10-CM | POA: Diagnosis not present

## 2022-08-29 DIAGNOSIS — E1159 Type 2 diabetes mellitus with other circulatory complications: Secondary | ICD-10-CM | POA: Diagnosis not present

## 2022-08-29 DIAGNOSIS — D509 Iron deficiency anemia, unspecified: Secondary | ICD-10-CM | POA: Diagnosis not present

## 2022-08-29 DIAGNOSIS — Z8041 Family history of malignant neoplasm of ovary: Secondary | ICD-10-CM | POA: Diagnosis not present

## 2022-08-29 DIAGNOSIS — E538 Deficiency of other specified B group vitamins: Secondary | ICD-10-CM | POA: Diagnosis not present

## 2022-08-29 DIAGNOSIS — D72829 Elevated white blood cell count, unspecified: Secondary | ICD-10-CM | POA: Diagnosis not present

## 2022-08-29 MED ORDER — SODIUM CHLORIDE 0.9 % IV SOLN: Freq: Once | INTRAVENOUS | Status: AC

## 2022-08-29 MED ORDER — SODIUM CHLORIDE 0.9 % IV SOLN
200.0000 mg | Freq: Once | INTRAVENOUS | Status: AC
  Administered 2022-08-29: 200 mg via INTRAVENOUS
  Filled 2022-08-29: qty 200

## 2022-08-29 MED FILL — Iron Sucrose Inj 20 MG/ML (Fe Equiv): INTRAVENOUS | Qty: 10 | Status: AC

## 2022-08-29 NOTE — Patient Instructions (Signed)

## 2022-08-30 ENCOUNTER — Inpatient Hospital Stay: Payer: Medicare HMO

## 2022-08-30 VITALS — BP 128/70 | HR 84 | Temp 97.9°F | Resp 18 | Ht 60.0 in | Wt 164.0 lb

## 2022-08-30 DIAGNOSIS — I10 Essential (primary) hypertension: Secondary | ICD-10-CM | POA: Diagnosis not present

## 2022-08-30 DIAGNOSIS — D509 Iron deficiency anemia, unspecified: Secondary | ICD-10-CM | POA: Diagnosis not present

## 2022-08-30 DIAGNOSIS — D500 Iron deficiency anemia secondary to blood loss (chronic): Secondary | ICD-10-CM

## 2022-08-30 DIAGNOSIS — Z8042 Family history of malignant neoplasm of prostate: Secondary | ICD-10-CM | POA: Diagnosis not present

## 2022-08-30 DIAGNOSIS — D72829 Elevated white blood cell count, unspecified: Secondary | ICD-10-CM | POA: Diagnosis not present

## 2022-08-30 DIAGNOSIS — Z79899 Other long term (current) drug therapy: Secondary | ICD-10-CM | POA: Diagnosis not present

## 2022-08-30 DIAGNOSIS — E538 Deficiency of other specified B group vitamins: Secondary | ICD-10-CM | POA: Diagnosis not present

## 2022-08-30 DIAGNOSIS — Z800 Family history of malignant neoplasm of digestive organs: Secondary | ICD-10-CM | POA: Diagnosis not present

## 2022-08-30 DIAGNOSIS — Z803 Family history of malignant neoplasm of breast: Secondary | ICD-10-CM | POA: Diagnosis not present

## 2022-08-30 DIAGNOSIS — E1159 Type 2 diabetes mellitus with other circulatory complications: Secondary | ICD-10-CM | POA: Diagnosis not present

## 2022-08-30 DIAGNOSIS — E1140 Type 2 diabetes mellitus with diabetic neuropathy, unspecified: Secondary | ICD-10-CM | POA: Diagnosis not present

## 2022-08-30 DIAGNOSIS — Z8041 Family history of malignant neoplasm of ovary: Secondary | ICD-10-CM | POA: Diagnosis not present

## 2022-08-30 LAB — BCR-ABL1 FISH
Cells Analyzed: 200
Cells Counted: 200

## 2022-08-30 MED ORDER — SODIUM CHLORIDE 0.9 % IV SOLN
200.0000 mg | Freq: Once | INTRAVENOUS | Status: AC
  Administered 2022-08-30: 200 mg via INTRAVENOUS
  Filled 2022-08-30: qty 200

## 2022-08-30 MED ORDER — SODIUM CHLORIDE 0.9 % IV SOLN: Freq: Once | INTRAVENOUS | Status: AC

## 2022-08-30 MED FILL — Iron Sucrose Inj 20 MG/ML (Fe Equiv): INTRAVENOUS | Qty: 10 | Status: AC

## 2022-08-30 NOTE — Patient Instructions (Signed)

## 2022-08-31 ENCOUNTER — Inpatient Hospital Stay: Payer: Medicare HMO

## 2022-08-31 VITALS — BP 139/50 | HR 77 | Temp 98.4°F | Resp 20 | Ht 60.0 in | Wt 164.0 lb

## 2022-08-31 DIAGNOSIS — E1140 Type 2 diabetes mellitus with diabetic neuropathy, unspecified: Secondary | ICD-10-CM | POA: Diagnosis not present

## 2022-08-31 DIAGNOSIS — Z79899 Other long term (current) drug therapy: Secondary | ICD-10-CM | POA: Diagnosis not present

## 2022-08-31 DIAGNOSIS — Z8041 Family history of malignant neoplasm of ovary: Secondary | ICD-10-CM | POA: Diagnosis not present

## 2022-08-31 DIAGNOSIS — Z8042 Family history of malignant neoplasm of prostate: Secondary | ICD-10-CM | POA: Diagnosis not present

## 2022-08-31 DIAGNOSIS — I10 Essential (primary) hypertension: Secondary | ICD-10-CM | POA: Diagnosis not present

## 2022-08-31 DIAGNOSIS — E538 Deficiency of other specified B group vitamins: Secondary | ICD-10-CM | POA: Diagnosis not present

## 2022-08-31 DIAGNOSIS — Z803 Family history of malignant neoplasm of breast: Secondary | ICD-10-CM | POA: Diagnosis not present

## 2022-08-31 DIAGNOSIS — D72829 Elevated white blood cell count, unspecified: Secondary | ICD-10-CM | POA: Diagnosis not present

## 2022-08-31 DIAGNOSIS — D509 Iron deficiency anemia, unspecified: Secondary | ICD-10-CM | POA: Diagnosis not present

## 2022-08-31 DIAGNOSIS — Z800 Family history of malignant neoplasm of digestive organs: Secondary | ICD-10-CM | POA: Diagnosis not present

## 2022-08-31 DIAGNOSIS — D500 Iron deficiency anemia secondary to blood loss (chronic): Secondary | ICD-10-CM

## 2022-08-31 DIAGNOSIS — E1159 Type 2 diabetes mellitus with other circulatory complications: Secondary | ICD-10-CM | POA: Diagnosis not present

## 2022-08-31 LAB — MULTIPLE MYELOMA PANEL, SERUM
Albumin SerPl Elph-Mcnc: 3.1 g/dL (ref 2.9–4.4)
Albumin/Glob SerPl: 1.2 (ref 0.7–1.7)
Alpha 1: 0.2 g/dL (ref 0.0–0.4)
Alpha2 Glob SerPl Elph-Mcnc: 1 g/dL (ref 0.4–1.0)
B-Globulin SerPl Elph-Mcnc: 0.9 g/dL (ref 0.7–1.3)
Gamma Glob SerPl Elph-Mcnc: 0.6 g/dL (ref 0.4–1.8)
Globulin, Total: 2.8 g/dL (ref 2.2–3.9)
IgA: 131 mg/dL (ref 64–422)
IgG (Immunoglobin G), Serum: 674 mg/dL (ref 586–1602)
IgM (Immunoglobulin M), Srm: 22 mg/dL — ABNORMAL LOW (ref 26–217)
M Protein SerPl Elph-Mcnc: 0.3 g/dL — ABNORMAL HIGH
Total Protein ELP: 5.9 g/dL — ABNORMAL LOW (ref 6.0–8.5)

## 2022-08-31 MED ORDER — SODIUM CHLORIDE 0.9 % IV SOLN
200.0000 mg | Freq: Once | INTRAVENOUS | Status: AC
  Administered 2022-08-31: 200 mg via INTRAVENOUS
  Filled 2022-08-31: qty 200

## 2022-08-31 MED ORDER — SODIUM CHLORIDE 0.9 % IV SOLN: Freq: Once | INTRAVENOUS | Status: AC

## 2022-08-31 MED FILL — Iron Sucrose Inj 20 MG/ML (Fe Equiv): INTRAVENOUS | Qty: 10 | Status: AC

## 2022-08-31 NOTE — Patient Instructions (Signed)

## 2022-09-01 ENCOUNTER — Telehealth: Payer: Self-pay

## 2022-09-01 ENCOUNTER — Inpatient Hospital Stay: Payer: Medicare HMO

## 2022-09-01 VITALS — BP 140/63 | HR 81 | Temp 98.0°F | Resp 18 | Wt 164.0 lb

## 2022-09-01 DIAGNOSIS — Z803 Family history of malignant neoplasm of breast: Secondary | ICD-10-CM | POA: Diagnosis not present

## 2022-09-01 DIAGNOSIS — D509 Iron deficiency anemia, unspecified: Secondary | ICD-10-CM | POA: Diagnosis not present

## 2022-09-01 DIAGNOSIS — Z800 Family history of malignant neoplasm of digestive organs: Secondary | ICD-10-CM | POA: Diagnosis not present

## 2022-09-01 DIAGNOSIS — E1159 Type 2 diabetes mellitus with other circulatory complications: Secondary | ICD-10-CM | POA: Diagnosis not present

## 2022-09-01 DIAGNOSIS — Z8041 Family history of malignant neoplasm of ovary: Secondary | ICD-10-CM | POA: Diagnosis not present

## 2022-09-01 DIAGNOSIS — E1140 Type 2 diabetes mellitus with diabetic neuropathy, unspecified: Secondary | ICD-10-CM | POA: Diagnosis not present

## 2022-09-01 DIAGNOSIS — Z79899 Other long term (current) drug therapy: Secondary | ICD-10-CM | POA: Diagnosis not present

## 2022-09-01 DIAGNOSIS — D72829 Elevated white blood cell count, unspecified: Secondary | ICD-10-CM | POA: Diagnosis not present

## 2022-09-01 DIAGNOSIS — I10 Essential (primary) hypertension: Secondary | ICD-10-CM | POA: Diagnosis not present

## 2022-09-01 DIAGNOSIS — D500 Iron deficiency anemia secondary to blood loss (chronic): Secondary | ICD-10-CM

## 2022-09-01 DIAGNOSIS — Z8042 Family history of malignant neoplasm of prostate: Secondary | ICD-10-CM | POA: Diagnosis not present

## 2022-09-01 DIAGNOSIS — E538 Deficiency of other specified B group vitamins: Secondary | ICD-10-CM | POA: Diagnosis not present

## 2022-09-01 LAB — JAK2 V617F RFX CALR/MPL/E12-15

## 2022-09-01 LAB — CALR +MPL + E12-E15  (REFLEX)

## 2022-09-01 MED ORDER — SODIUM CHLORIDE 0.9 % IV SOLN
200.0000 mg | Freq: Once | INTRAVENOUS | Status: AC
  Administered 2022-09-01: 200 mg via INTRAVENOUS
  Filled 2022-09-01: qty 200

## 2022-09-01 MED ORDER — SODIUM CHLORIDE 0.9 % IV SOLN: Freq: Once | INTRAVENOUS | Status: AC

## 2022-09-01 MED FILL — Iron Sucrose Inj 20 MG/ML (Fe Equiv): INTRAVENOUS | Qty: 10 | Status: AC

## 2022-09-01 NOTE — Patient Instructions (Signed)

## 2022-09-01 NOTE — Telephone Encounter (Signed)
Patient aware of appt with Dr Kathe Mariner office on 10/24/2022 at 1130.

## 2022-09-02 ENCOUNTER — Inpatient Hospital Stay: Payer: Medicare HMO

## 2022-09-02 VITALS — BP 158/61 | HR 97 | Temp 99.1°F | Resp 16 | Ht 60.0 in | Wt 163.1 lb

## 2022-09-02 DIAGNOSIS — E1140 Type 2 diabetes mellitus with diabetic neuropathy, unspecified: Secondary | ICD-10-CM | POA: Diagnosis not present

## 2022-09-02 DIAGNOSIS — D509 Iron deficiency anemia, unspecified: Secondary | ICD-10-CM | POA: Diagnosis not present

## 2022-09-02 DIAGNOSIS — Z79899 Other long term (current) drug therapy: Secondary | ICD-10-CM | POA: Diagnosis not present

## 2022-09-02 DIAGNOSIS — Z8042 Family history of malignant neoplasm of prostate: Secondary | ICD-10-CM | POA: Diagnosis not present

## 2022-09-02 DIAGNOSIS — E538 Deficiency of other specified B group vitamins: Secondary | ICD-10-CM | POA: Diagnosis not present

## 2022-09-02 DIAGNOSIS — D72829 Elevated white blood cell count, unspecified: Secondary | ICD-10-CM | POA: Diagnosis not present

## 2022-09-02 DIAGNOSIS — I10 Essential (primary) hypertension: Secondary | ICD-10-CM | POA: Diagnosis not present

## 2022-09-02 DIAGNOSIS — E1159 Type 2 diabetes mellitus with other circulatory complications: Secondary | ICD-10-CM | POA: Diagnosis not present

## 2022-09-02 DIAGNOSIS — Z803 Family history of malignant neoplasm of breast: Secondary | ICD-10-CM | POA: Diagnosis not present

## 2022-09-02 DIAGNOSIS — Z8041 Family history of malignant neoplasm of ovary: Secondary | ICD-10-CM | POA: Diagnosis not present

## 2022-09-02 DIAGNOSIS — D500 Iron deficiency anemia secondary to blood loss (chronic): Secondary | ICD-10-CM

## 2022-09-02 DIAGNOSIS — Z800 Family history of malignant neoplasm of digestive organs: Secondary | ICD-10-CM | POA: Diagnosis not present

## 2022-09-02 MED ORDER — SODIUM CHLORIDE 0.9 % IV SOLN: Freq: Once | INTRAVENOUS | Status: AC

## 2022-09-02 MED ORDER — SODIUM CHLORIDE 0.9 % IV SOLN
200.0000 mg | Freq: Once | INTRAVENOUS | Status: AC
  Administered 2022-09-02: 200 mg via INTRAVENOUS
  Filled 2022-09-02: qty 200

## 2022-09-02 NOTE — Patient Instructions (Signed)

## 2022-09-05 ENCOUNTER — Telehealth: Payer: Self-pay | Admitting: Oncology

## 2022-09-05 ENCOUNTER — Inpatient Hospital Stay (INDEPENDENT_AMBULATORY_CARE_PROVIDER_SITE_OTHER): Payer: Medicare HMO | Admitting: Oncology

## 2022-09-05 ENCOUNTER — Inpatient Hospital Stay: Payer: Medicare HMO

## 2022-09-05 VITALS — BP 165/66 | HR 87 | Temp 98.1°F | Resp 16 | Ht 60.0 in | Wt 160.9 lb

## 2022-09-05 DIAGNOSIS — E8809 Other disorders of plasma-protein metabolism, not elsewhere classified: Secondary | ICD-10-CM

## 2022-09-05 DIAGNOSIS — E1140 Type 2 diabetes mellitus with diabetic neuropathy, unspecified: Secondary | ICD-10-CM | POA: Diagnosis not present

## 2022-09-05 DIAGNOSIS — Z8041 Family history of malignant neoplasm of ovary: Secondary | ICD-10-CM | POA: Diagnosis not present

## 2022-09-05 DIAGNOSIS — E538 Deficiency of other specified B group vitamins: Secondary | ICD-10-CM | POA: Diagnosis not present

## 2022-09-05 DIAGNOSIS — D500 Iron deficiency anemia secondary to blood loss (chronic): Secondary | ICD-10-CM | POA: Diagnosis not present

## 2022-09-05 DIAGNOSIS — D649 Anemia, unspecified: Secondary | ICD-10-CM | POA: Insufficient documentation

## 2022-09-05 DIAGNOSIS — Z79899 Other long term (current) drug therapy: Secondary | ICD-10-CM | POA: Diagnosis not present

## 2022-09-05 DIAGNOSIS — Z8042 Family history of malignant neoplasm of prostate: Secondary | ICD-10-CM | POA: Diagnosis not present

## 2022-09-05 DIAGNOSIS — D472 Monoclonal gammopathy: Secondary | ICD-10-CM | POA: Diagnosis not present

## 2022-09-05 DIAGNOSIS — D72828 Other elevated white blood cell count: Secondary | ICD-10-CM | POA: Diagnosis not present

## 2022-09-05 DIAGNOSIS — D72829 Elevated white blood cell count, unspecified: Secondary | ICD-10-CM | POA: Diagnosis not present

## 2022-09-05 DIAGNOSIS — E1159 Type 2 diabetes mellitus with other circulatory complications: Secondary | ICD-10-CM | POA: Diagnosis not present

## 2022-09-05 DIAGNOSIS — Z800 Family history of malignant neoplasm of digestive organs: Secondary | ICD-10-CM | POA: Diagnosis not present

## 2022-09-05 DIAGNOSIS — Z803 Family history of malignant neoplasm of breast: Secondary | ICD-10-CM | POA: Diagnosis not present

## 2022-09-05 DIAGNOSIS — I10 Essential (primary) hypertension: Secondary | ICD-10-CM | POA: Diagnosis not present

## 2022-09-05 DIAGNOSIS — D509 Iron deficiency anemia, unspecified: Secondary | ICD-10-CM | POA: Diagnosis not present

## 2022-09-05 LAB — CBC WITH DIFFERENTIAL/PLATELET
Abs Immature Granulocytes: 0.05 10*3/uL (ref 0.00–0.07)
Basophils Absolute: 0.1 10*3/uL (ref 0.0–0.1)
Basophils Relative: 1 %
Eosinophils Absolute: 0.2 10*3/uL (ref 0.0–0.5)
Eosinophils Relative: 2 %
HCT: 35.5 % — ABNORMAL LOW (ref 36.0–46.0)
Hemoglobin: 10.7 g/dL — ABNORMAL LOW (ref 12.0–15.0)
Immature Granulocytes: 0 %
Lymphocytes Relative: 26 %
Lymphs Abs: 3.4 10*3/uL (ref 0.7–4.0)
MCH: 23.5 pg — ABNORMAL LOW (ref 26.0–34.0)
MCHC: 30.1 g/dL (ref 30.0–36.0)
MCV: 77.9 fL — ABNORMAL LOW (ref 80.0–100.0)
Monocytes Absolute: 0.9 10*3/uL (ref 0.1–1.0)
Monocytes Relative: 7 %
Neutro Abs: 8.6 10*3/uL — ABNORMAL HIGH (ref 1.7–7.7)
Neutrophils Relative %: 64 %
Platelets: 352 10*3/uL (ref 150–400)
RBC: 4.56 MIL/uL (ref 3.87–5.11)
RDW: 23.4 % — ABNORMAL HIGH (ref 11.5–15.5)
WBC: 13.1 10*3/uL — ABNORMAL HIGH (ref 4.0–10.5)
nRBC: 0 % (ref 0.0–0.2)

## 2022-09-05 LAB — DIRECT ANTIGLOBULIN TEST (NOT AT ARMC)
DAT, IgG: NEGATIVE
DAT, complement: NEGATIVE

## 2022-09-05 LAB — LACTATE DEHYDROGENASE: LDH: 161 U/L (ref 98–192)

## 2022-09-05 NOTE — Progress Notes (Unsigned)
[facility] Initial Visit:  Patient Care Team: Marga Hoots, NP as PCP - General  CHIEF COMPLAINTS/PURPOSE OF CONSULTATION:   HISTORY OF PRESENTING ILLNESS: Jodi Nelson 82 y.o. 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  BI-RADS CATEGORY 1: Negative.   August 24, 2022: [place] hematology consult History notable for EGD and colonoscopy in 2013.  EGD and H. pylori negative.  Colonoscopy revealed mild colonic diverticular disease and scattered right colonic diverticula   WBC 14.3 hemoglobin 11.0 MCV 75 platelet count 424; 67 segs 25 lymphs 6 monos 2 eos Haptoglobin 373 SPEP with IEP showed 0.3 g/dL of an IgG lambda monoclonal protein IgG 674 IgA 131 IgM 22 Ferritin 7 folate 15 B12 291  FISH for BCR able negative JAK2 with reflex panel negative  Urinalysis notable for large number of leukocytes positive for nitrites rare bacteria CMP notable for glucose of 140 calcium 8.8 albumin 3.0 total protein 6.3 T. bili undetectable  February 19 -August 31 2022:  Received total of 1000 mg Venofer  September 05 2022:  Scheduled follow up for management of anemia  Denies weight loss.  Her biggest concern has been neuropathy in feet and legs.  Some involvement of hands.  As a result of neuropathy ambulates with a rollator.   She also has edema for which she takes diuretics.  Feels better since receiving IV iron.  Takes ASA daily but not on any anticoagulants.     Review of Systems - Oncology  MEDICAL HISTORY: Past Medical History:  Diagnosis Date  . Diabetes ([place])    type two  . Diabetic neuropathy ([place])   . Hyperlipidemia   . Hypertension   . Osteoporosis   . Vitamin B 12 deficiency   . Vitamin D  deficiency     SURGICAL HISTORY: Past Surgical History:  Procedure Laterality Date  . BLADDER SURGERY    . BREAST BIOPSY Right 08/10/2011  . BREAST BIOPSY Left 03/2019   clip placement  . BREAST LUMPECTOMY Right    reported as benign  . CATARACT EXTRACTION Bilateral   . FOOT SURGERY Left   . TONSILLECTOMY AND ADENOIDECTOMY      SOCIAL HISTORY: Social History   Socioeconomic History  . Marital status: Married    Spouse name: Barnabas Lister  . Number of children: 3  . Years of education: 49  . Highest education level: High school graduate  Occupational History  . Occupation: retired Tourist information centre manager  Tobacco Use  . Smoking status: Never  . Smokeless tobacco: Never  Vaping Use  . Vaping Use: Never used  Substance and Sexual Activity  . Alcohol use: Never  . Drug use: Never  . Sexual activity: Not Currently  Other Topics Concern  . Not on file  Social History Narrative  . Not on file   Social Determinants of Health   Financial Resource Strain: Not on file  Food Insecurity: No Food Insecurity (08/24/2022)   Hunger Vital Sign   . Worried About Charity fundraiser in the Last Year: Never true   . Ran Out of Food in the Last Year: Never true  Transportation Needs: No Transportation Needs (08/24/2022)   PRAPARE - Transportation   . Lack of Transportation (Medical): No   . Lack of Transportation (Non-Medical): No  Physical  Activity: Not on file  Stress: Not on file  Social Connections: Not on file  Intimate Partner Violence: Not At Risk (08/24/2022)   Humiliation, Afraid, Rape, and Kick questionnaire   . Fear of Current or Ex-Partner: No   . Emotionally Abused: No   . Physically Abused: No   . Sexually Abused: No    FAMILY HISTORY Family History  Problem Relation Age of Onset  . Colon cancer Mother   . Prostate cancer Father   . Breast cancer Sister   . Kidney disease Sister     ALLERGIES:  is allergic to biaxin [clarithromycin], doxycycline, niaspan [niacin], tetracyclines &  related, zithromax [azithromycin], erythromycin base, penicillins, and sulfa antibiotics.  MEDICATIONS:  Current Outpatient Medications  Medication Sig Dispense Refill  . albuterol (VENTOLIN HFA) 108 (90 Base) MCG/ACT inhaler Inhale 2 puffs into the lungs every 4 (four) hours as needed for wheezing or shortness of breath.    Marland Kitchen aspirin EC 81 MG tablet Take 81 mg by mouth daily. Swallow whole.    Marland Kitchen atorvastatin (LIPITOR) 80 MG tablet Take 80 mg by mouth daily.    Marland Kitchen co-enzyme Q-10 30 MG capsule Take 100 mg by mouth daily. 2 tabs  taking '200mg'$  daily    . furosemide (LASIX) 20 MG tablet Take 20 mg by mouth every Monday, Wednesday, and Friday.    . gabapentin (NEURONTIN) 600 MG tablet Take 600 mg by mouth in the morning, at noon, in the evening, and at bedtime.    . lactobacillus (FLORANEX/LACTINEX) PACK Take 1 g by mouth 3 (three) times daily with meals.    Marland Kitchen LORazepam (ATIVAN) 1 MG tablet Take 1 mg by mouth every 6 (six) hours as needed for anxiety.    . metFORMIN (GLUCOPHAGE) 1000 MG tablet Take 1,000 mg by mouth 2 (two) times daily with a meal.    . nortriptyline (PAMELOR) 75 MG capsule Take 75 mg by mouth at bedtime.    . Omega-3 1000 MG CAPS Take by mouth.    . pioglitazone (ACTOS) 15 MG tablet Take 15 mg by mouth daily.    . pregabalin (LYRICA) 75 MG capsule Take 75 mg by mouth at bedtime.    . Probiotic Product (PROBIOTIC ADVANCED PO) Take by mouth.    . timolol (TIMOPTIC) 0.5 % ophthalmic solution 1 drop 2 (two) times daily.     No current facility-administered medications for this visit.    PHYSICAL EXAMINATION:  ECOG PERFORMANCE STATUS: {CHL ONC ECOG [id number]   Vitals:   09/05/22 0953  BP: (!) 165/66  Pulse: 87  Resp: 16  Temp: 98.1 F (36.7 C)  SpO2: 97%    Filed Weights   09/05/22 0953  Weight: 160 lb 14.4 oz (73 kg)     Physical Exam Vitals and nursing note reviewed.  Constitutional:      General: She is not in acute distress.    Appearance: Normal  appearance. She is not toxic-appearing or diaphoretic.     Comments: Here with husband and daughter.  Has rollator.  Elderly appearing  HENT:     Head: Normocephalic and atraumatic.     Right Ear: External ear normal.     Left Ear: External ear normal.     Nose: Nose normal. No congestion or rhinorrhea.  Eyes:     General: No scleral icterus.    Extraocular Movements: Extraocular movements intact.     Conjunctiva/sclera: Conjunctivae normal.     Pupils: Pupils are equal, round, and reactive to  light.  Cardiovascular:     Rate and Rhythm: Normal rate and regular rhythm.     Heart sounds: No murmur heard.    No friction rub. No gallop.  Abdominal:     General: Bowel sounds are normal.     Palpations: Abdomen is soft.     Tenderness: There is no abdominal tenderness. There is no guarding or rebound.  Musculoskeletal:        General: No swelling, tenderness or deformity.     Cervical back: Normal range of motion and neck supple. No rigidity or tenderness.  Lymphadenopathy:     Head:     Right side of head: No submental, submandibular, tonsillar, preauricular, posterior auricular or occipital adenopathy.     Left side of head: No submental, submandibular, tonsillar, preauricular, posterior auricular or occipital adenopathy.     Cervical: No cervical adenopathy.     Right cervical: No superficial, deep or posterior cervical adenopathy.    Left cervical: No superficial, deep or posterior cervical adenopathy.     Upper Body:     Right upper body: No supraclavicular, axillary, pectoral or epitrochlear adenopathy.     Left upper body: No supraclavicular, axillary, pectoral or epitrochlear adenopathy.  Skin:    General: Skin is warm.     Coloration: Skin is not jaundiced or pale.     Findings: Bruising present.  Neurological:     General: No focal deficit present.     Mental Status: She is alert and oriented to person, place, and time.     Cranial Nerves: No cranial nerve deficit.      Gait: Gait abnormal.  Psychiatric:        Mood and Affect: Mood normal.        Behavior: Behavior normal.        Thought Content: Thought content normal.        Judgment: Judgment normal.    LABORATORY DATA: I have personally reviewed the data as listed:  Appointment on 08/26/2022  Component Date Value Ref Range Status  . Specimen Type 08/26/2022 BLOOD   Final  . Cells Counted 08/26/2022 200   Final  . Cells Analyzed 08/26/2022 200   Final  . FISH Result 08/26/2022 Comment:   Final   NORMAL:  NO BCR OR ABL1 GENE REARRANGEMENT OBSERVED  . Interpretation 08/26/2022 Comment:   Final   Comment: (NOTE)             nuc ish 9q34(ASS1,ABL1)x2,22q11.2(BCRx2)[200].      The fluorescence in situ hybridization (FISH) study was normal. FISH, using unique sequence DNA probes for the ABL1 and BCR gene regions showed two ABL1 signals (red), two control ASS1 gene signals (aqua) located adjacent to the ABL1 locus at 9q34, and two BCR signals (green) at 22q11.2 in all interphase nuclei examined. There was NO evidence of CML or ALL-associated BCR::ABL1 dual fusion signals in this analysis. .      This analysis is limited to abnormalities detectable by the specific probes included in the study. FISH results should be interpreted within the context of a full cytogenetic analysis and pathology evaluation.  A BCR::ABL1 gene fusion in greater than 3 interphase nuclei in a patient with a new clinical diagnosis is considered positive. The DNA probe vendor for this study was [employer], research (physical sciences)). .      This test was developed and its performance characteristics determined  by [employer] ([place]). It has not been cleared or approved by the U.S. Food and Drug Administration.   . Director Review: 08/26/2022 Comment:   Final   Comment: (NOTE) Leonette Monarch, PhD, [facility] Performed At: [facility] [place], [place]  [id number] Katina Degree MDPhD [id number]   Clinical Support on 08/24/2022  Component Date Value Ref Range Status  . Color, Urine 08/24/2022 YELLOW  YELLOW Final  . APPearance 08/24/2022 HAZY (A)  CLEAR Final  . Specific Gravity, Urine 08/24/2022 1.011  1.005 - 1.030 Final  . pH 08/24/2022 6.0  5.0 - 8.0 Final  . Glucose, UA 08/24/2022 NEGATIVE  NEGATIVE mg/dL Final  . Hgb urine dipstick 08/24/2022 NEGATIVE  NEGATIVE Final  . Bilirubin Urine 08/24/2022 NEGATIVE  NEGATIVE Final  . Ketones, ur 08/24/2022 NEGATIVE  NEGATIVE mg/dL Final  . Protein, ur 08/24/2022 NEGATIVE  NEGATIVE mg/dL Final  . Nitrite 08/24/2022 POSITIVE (A)  NEGATIVE Final  . Leukocytes,Ua 08/24/2022 LARGE (A)  NEGATIVE Final  . RBC / HPF 08/24/2022 0-5  0 - 5 RBC/hpf Final  . WBC, UA 08/24/2022 >50  0 - 5 WBC/hpf Final  . Bacteria, UA 08/24/2022 RARE (A)  NONE SEEN Final  . Squamous Epithelial / HPF 08/24/2022 0-5  0 - 5 /HPF Final  . Mucus 08/24/2022 PRESENT   Final   Performed at [facility], [place] [address] [place], [place] [address]  . Specimen Type 08/24/2022 WRORD   Final   Comment: (NOTE) Test not performed. The required specimen for the test ordered was not received.      Requires green or lavender top tube      Received whole blood in a transfer tube      Jolande M. was notified 08/25/2022   . Vitamin B-12 08/24/2022 291  180 - 914 pg/mL Final   Comment: (NOTE) This assay is not validated for testing neonatal or myeloproliferative syndrome specimens for Vitamin B12 levels. Performed at [facility], [place] [address] [place], [place] [address]   . Haptoglobin 08/24/2022 373 (H)  42 - 346 mg/dL Final   Comment: (NOTE) Performed At: [facility] [place], [place] [id number] Rush Farmer MD [id number]   . IgG (Immunoglobin G), Serum 08/24/2022 674  586 - 1,602 mg/dL Final  . IgA 08/24/2022 131  64 - 422 mg/dL Final  . IgM  (Immunoglobulin M), Srm 08/24/2022 22 (L)  26 - 217 mg/dL Final   Result confirmed on concentration.  . Total Protein ELP 08/24/2022 5.9 (L)  6.0 - 8.5 g/dL Corrected  . Albumin SerPl Elph-Mcnc 08/24/2022 3.1  2.9 - 4.4 g/dL Corrected  . Alpha 1 08/24/2022 0.2  0.0 - 0.4 g/dL Corrected  . Alpha2 Glob SerPl Elph-Mcnc 08/24/2022 1.0  0.4 - 1.0 g/dL Corrected  . B-Globulin SerPl Elph-Mcnc 08/24/2022 0.9  0.7 - 1.3 g/dL Corrected  . Gamma Glob SerPl Elph-Mcnc 08/24/2022 0.6  0.4 - 1.8 g/dL Corrected  . M Protein SerPl Elph-Mcnc 08/24/2022 0.3 (H)  Not Observed g/dL Corrected  . Globulin, Total 08/24/2022 2.8  2.2 - 3.9 g/dL Corrected  . Albumin/Glob SerPl 08/24/2022 1.2  0.7 - 1.7 Corrected  . 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   . Please Note 08/24/2022 Comment   Corrected   Comment: (NOTE) Protein electrophoresis scan will follow via computer, mail, or  courier delivery. Performed At: [facility] [place], [place] [id number] Rush Farmer MD [id number]   . WBC MORPHOLOGY 08/24/2022 VACUOLATED NEUTROPHILS   Final  . RBC MORPHOLOGY 08/24/2022 BURR CELLS   Final   Comment: Acanthocytes present POLYCHROMASIA PRESENT OVALOCYTES   . Plt Morphology 08/24/2022 MORPHOLOGY UNREMARKABLE   Final  . Clinical Information 08/24/2022 leukocytosis, anemia   Final   Performed at [place] [address], [place], [place] [address]  . Folate 08/24/2022 15.0  >5.9 ng/mL Final   Performed at [place] [address], [place], [place] [address]  . Iron 08/24/2022 35  28 - 170 ug/dL Final  . TIBC 08/24/2022 461 (H)  250 - 450 ug/dL Final  . Saturation Ratios 08/24/2022 8 (L)  10.4 - 31.8 % Final  . UIBC 08/24/2022 426  ug/dL Final   Performed at [facility], [place] [address], [place], [place] [address]  .  Ferritin 08/24/2022 7 (L)  11 - 307 ng/mL Final   Performed at [place] [address], [place], [place] [address]  . Sodium 08/24/2022 138  135 - 145 mmol/L Final  . Potassium 08/24/2022 3.9  3.5 - 5.1 mmol/L Final  . Chloride 08/24/2022 103  98 - 111 mmol/L Final  . CO2 08/24/2022 25  22 - 32 mmol/L Final  . Glucose, Bld 08/24/2022 140 (H)  70 - 99 mg/dL Final   Glucose reference range applies only to samples taken after fasting for at least 8 hours.  . BUN 08/24/2022 15  8 - 23 mg/dL Final  . Creatinine 08/24/2022 0.75  0.44 - 1.00 mg/dL Final  . Calcium 08/24/2022 8.8 (L)  8.9 - 10.3 mg/dL Final  . Total Protein 08/24/2022 6.3 (L)  6.5 - 8.1 g/dL Final  . Albumin 08/24/2022 3.0 (L)  3.5 - 5.0 g/dL Final  . AST 08/24/2022 19  15 - 41 U/L Final  . ALT 08/24/2022 19  0 - 44 U/L Final  . Alkaline Phosphatase 08/24/2022 66  38 - 126 U/L Final  . Total Bilirubin 08/24/2022 <0.1 (L)  0.3 - 1.2 mg/dL Final  . GFR, Estimated 08/24/2022 >60  >60 mL/min Final   Comment: (NOTE) Calculated using the CKD-EPI Creatinine Equation (2021)   . Anion gap 08/24/2022 10  5 - 15 Final   Performed at [facility], [place] [address], [place], [place] [address]  . WBC Count 08/24/2022 14.3 (H)  4.0 - 10.5 K/uL Final  . RBC 08/24/2022 4.81  3.87 - 5.11 MIL/uL Final  . Hemoglobin 08/24/2022 11.0 (L)  12.0 - 15.0 g/dL Final  . HCT 08/24/2022 36.2  36.0 - 46.0 % Final  . MCV 08/24/2022 75.3 (L)  80.0 - 100.0 fL Final  . MCH 08/24/2022 22.9 (L)  26.0 - 34.0 pg Final  . MCHC 08/24/2022 30.4  30.0 - 36.0 g/dL Final  . RDW 08/24/2022 21.4 (H)  11.5 - 15.5 % Final  . Platelet Count 08/24/2022 424 (H)  150 - 400 K/uL Final  . nRBC 08/24/2022 0.0  0.0 - 0.2 % Final  . Neutrophils Relative % 08/24/2022 67  % Final  . Neutro Abs 08/24/2022 9.4 (H)  1.7 - 7.7 K/uL Final  . Lymphocytes Relative 08/24/2022 25  % Final  . Lymphs Abs 08/24/2022 3.6  0.7 - 4.0 K/uL Final  .  Monocytes Relative 08/24/2022 6  % Final  . Monocytes Absolute 08/24/2022 0.9  0.1 - 1.0 K/uL Final  . Eosinophils Relative 08/24/2022 2  % Final  . Eosinophils Absolute 08/24/2022 0.3  0.0 - 0.5 K/uL Final  . Basophils Relative 08/24/2022 0  % Final  . Basophils Absolute 08/24/2022 0.1  0.0 - 0.1 K/uL Final  . Immature Granulocytes 08/24/2022 0  % Final  . Abs Immature Granulocytes 08/24/2022 0.06  0.00 - 0.07 K/uL Final  . Acanthocytes 08/24/2022 PRESENT   Final  . Polychromasia 08/24/2022 PRESENT   Final  . Target Cells 08/24/2022 PRESENT   Final   Performed at [facility], [place] [address], [place], [place] [address]  . JAK2 V617F Result 08/24/2022 Comment   Final   Comment: (NOTE) NEGATIVE The JAK2 V617F mutation is not detected in the provided specimen of this individual. Results should be interpreted in conjunction with clinical and other laboratory findings for the most accurate interpretation. This test was developed and its performance characteristics determined by [facility]. It has not been cleared or approved by the Food and Drug Administration.   . Reflex 08/24/2022 Comment   Final   Comment: (NOTE) Reflex to CALR Mutation Analysis, JAK2 Exon 12-15 Mutation Analysis, and MPL Mutation Analysis is indicated.   . V617F Rfx CALR/MPL/E12-15 Bkgd 08/24/2022 Comment   Final   Comment: (NOTE) Molecular testing of blood or bone marrow is useful in the evaluation of suspected myeloproliferative neoplasms (MPN). Mutations in the JAK2, MPL, and CALR genes are present in virtually all MPNs and their presence help distinguish benign reactive processes from clonal neoplasms. These mutations are generally considered mutually exclusive, although concurrent clones have been reported in rare patients. This test will assess for the JAK2V617F (exon 14) mutation first and will reflex to CALR mutation analysis, MPL mutation analysis, and JAK2 exon 12 to 15 mutation  analysis if the JAK2V617F mutation is negative. The JAK2 (Janus kinase 2) gene encodes for a non-receptor protein tyrosine kinase that activates cytokine and growth factor signaling. The V617F (c.1849 G>T) mutation results in constitutive activation of JAK2 and downstream STAT5 and ERK signaling. The V617F mutation is observed in approximately 95% of polycythemia vera (PV), 60% of essential thrombocythemi                          a (ET) and primary myelofibrosis (PMF). It is also infrequently present (3-5%) in myelodysplastic syndrome, chronic myelomonocytic leukemia, and other atypical chronic myeloid disorders. A small percentage of JAK2 mutation positive patients (3.3%) contain other non-V617F mutations within exons 12 to 15. In particular, mutations in exon 12 of JAK2 have been described in approximately 3% of patients with PV. JAK2 allele burden correlates with clinical phenotype, with low levels of mutant allele characterized by thrombocytosis, intermediate levels with erythrocytosis, and high mutant allele burden correlating with enhanced myelopoiesis of the BM, leukocytosis, increasing spleen size, and circulating CD34-positive cells. The CALR (Calreticulin) gene encodes for a multifunctional calcium-binding protein involved in many cellular activities such as growth, proliferation, adhesion, and programmed cell death. Among patients with JAK2 negative MPNs, CALR are found in approximat                          ely 70% of patients with JAK2-negative essential thrombocythemia (ET) and 60-88% of patients with JAK2-negative primary myelofibrosis(PMF). Only a minority of patients (approximately 8%) with myelodysplasia have mutations in the CALR gene. CALR mutations are rarely detected in patients with de novo acute myeloid  leukemia, chronic myelogenous leukemia, lymphoid leukemia, or solid tumors. CALR mutations are not detected in polycythemia and generally appear to be mutually  exclusive with JAK2 mutations and MPL mutations. The majority of mutational changes involve a variety of insertion deletion mutations in exon 9 of the calreticulin gene: approximately 53% of all CALR mutations are a 52 bp deletion (type-1) while the second most prevalent mutation (approximately 32%) contains a 5 bp insertion (type-2). Other mutations (non-type 1 or type 2) are seen in a small minority of cases. CALR mutations in PMF tend to be with a favorable prognosis compared to JAK2 V617F mutations, w                          hereas primary myelofibrosis negative for CALR, JAK2 V617F and MPL mutations (so-called triple negative) is associated with a poor prognosis and shorter survival. The MPL (myeloproliferative leukemia virus oncogene) gene encodes the thrombopoietin receptor which regulates hematopoiesis and megakaryopoiesis. Activating MPL mutations are associated with a subset of myeloproliferative neoplasms and acute megakaryoblastic leukemia. MPL W515 mutations are present in approximately 5-8% of patients with primary myelofibrosis (PMF) and 1-4% of patients with essential thrombocythemia (ET). The S505 mutation is detected in patients with hereditary thrombocythemia. Limitations This assay has a sensitivity of approximately 1% VAF for JAK2 V617F, 2.5% VAF for other mutations in JAK2 exons 12 to 15, CALR mutations, and MPL mutations.   . Method 08/24/2022 based next generation sequencing.   Final   Comment: Comment [facility]   . References 08/24/2022 Comment   Final   Comment: (NOTE) Alghasham N, Alnouri Y, Abalkhail Carmela Hurt. Detection of mutations in JAK2 exons 12-15 by [employer] sequencing. Int J Lab Hematol. 2016 Feb;38(1):34-41. doi: 10.1111/ijlh.CE:2193090. Epub 2015 Sep 11. PMID: [id number]. Octavia Heir, One[place] Arenas, Hasserjian R, Linward Foster, Borowitz MJ, Zada Finders MM, [place] CD, [place], [place] JW. The 2016 revision to the World Health Organization classification of  myeloid neoplasms and acute leukemia. Blood. 2016 May 19;127(20):2391-405. doi: 10.1182/blood-2016-03-643544. Epub 2016 Apr 11. PMID: [id number]. Blima Ledger [facility], Zhang ZJ, [place] S, Albitar M. Mutation profile of JAK2 transcripts in patients with chronic myeloproliferative neoplasias. J Mol Diagn. 2009 Jan;11(1):49-53.doi: 10.2353/jmoldx.2009.080114. Epub 2008 Dec 12. PMID: [id number]; PMCIDXZ:7723798. NCCN Clinical Practice Guidelines in Oncology (NCCN Guidelines) Myeloproliferative Neoplasms Version 3.2022 - February 18, 2021. Swerdlow SH, English as a second language teacher. WHO classif                          ication of Tumours of Haematopoietic and Lymphoid Tissues. 4th edn. Nada Maclachlan, [place]: Air cabin crew for [employer] on [employer]; 2017. Tefferi A. Primary myelofibrosis: 2021 update on diagnosis, risk-stratification and management. Am J Hematol. 2021 Jan;96(1):145-162. doi: 10.1002/ajh.26050. Epub 2020 Dec 2. PMID: [id number]. Grier Mitts, Kralovics R. Genetic basis and molecular pathophysiology of classical myeloproliferative neoplasms. Blood. 2017 Feb 9;129(6):667-679. doi: 10.1182/blood-2016-10-695940. Epub 2016 Dec 27. PMID: [id number].   Marland Kitchen Director Review 08/24/2022 Comment   Final   Comment: (NOTE) Technical Component performed at [employer] [facility] Professional Component performed by: Constance Goltz, PhD, [facility] Director, [place] [address] [place] [address] [phone] Performed At: [facility] [address] [place], [place] [id number] Katina Degree MDPhD [id number] Performed At: [facility] [place], [place] [id number] Katina Degree MDPhD [id number]   . CALR Result 08/24/2022 Comment   Final   Comment: (NOTE) NEGATIVE No insertions or deletions were  detected within the analyzed region of the calreticulin (CALR) gene. A negative result does not entirely exclude the possibility of a clonal population carrying CALR gene mutations that are not  covered by this assay. Results should be interpreted in conjunction with clinical and laboratory findings for the most accurate interpretation.   . MPL Result 08/24/2022 Comment   Final   Comment: (NOTE) NEGATIVE No MPL mutation was identified in the provided specimen of this individual. Results should be interpreted in conjunction with clinical and other laboratory findings for the most accurate interpretation.   . E12-15 Result 08/24/2022 Comment   Final   Comment: (NOTE) NEGATIVE JAK2 mutations were not detected in exons 12, 13, 14 and 15. This result does not rule out the presence of JAK2 mutation at a level below the detection sensitivity of this assay, the presence of other mutations outside the analyzed region of the JAK2 gene, or the presence of a myeloproliferative or other neoplasm. Result must be correlated with other clinical data for the most accurate diagnosis. Performed At: [facility] [address] [place], [place] [id number] Katina Degree MDPhD [id number]     RADIOGRAPHIC STUDIES: I have personally reviewed the radiological images as listed and agree with the findings in the report  No results found.  ASSESSMENT/PLAN Cancer Staging  No matching staging information was found for the patient.   No problem-specific Assessment & Plan notes found for this encounter.    No orders of the defined types were placed in this encounter.     minutes was spent in patient care.  This included time spent preparing to see the patient (e.g., review of tests), obtaining and/or reviewing separately obtained history, counseling and educating the patient/family/caregiver, ordering medications, tests, or procedures; documenting clinical information in the electronic or other health record, independently interpreting results and communicating results to the patient/family/caregiver as well as coordination of care.       All questions were answered. The patient knows  to call the clinic with any problems, questions or concerns.  This note was electronically signed.    Barbee Cough, MD  09/05/2022 9:57 AM

## 2022-09-05 NOTE — Patient Instructions (Signed)
Monoclonal Gammopathy of Undetermined Significance Monoclonal gammopathy of undetermined significance (MGUS) is a condition in which there is too much of a protein called monoclonal protein, or M protein, in the blood. MGUS can cause you to have too many cells in your blood and not enough space for healthy cells. This condition may not have any symptoms, but it may increase your risk of developing multiple myeloma or other blood disorders in the future. What are the causes? The cause of this condition is not known. Genetics and the environment may play a role. What increases the risk? You are more likely to develop this condition if: You are African American. You are age 81 or older. You are female. You have an autoimmune disease. You have been exposed to radiation. You have a family history of MGUS. What are the signs or symptoms? There are usually no symptoms of this condition. In rare cases, some people may have: Numbness or tingling in the hands, lower legs, or feet. Bone problems. Frequent infections. How is this diagnosed? This condition may be diagnosed with tests that check for the M protein, such as: A blood test. A urine test. How is this treated? Treatment may involve monitoring your condition. This may include: Having regular exams. This will allow your health care provider to monitor your health. Having tests done regularly, such as: Blood tests to check for M protein in your body. Imaging tests, such as a CT scan. A bone marrow biopsy. This test involves taking a sample of bone marrow from your body so it can be checked under a microscope. Follow these instructions at home: Take over-the-counter and prescription medicines only as told by your health care provider. Keep all follow-up visits. Where to find more information International Myeloma Foundation: myeloma.org Contact a health care provider if: You have trouble swallowing. You have pain in your back or ribs. You  have numbness or tingling in your hands, lower legs, or feet. You have a fever. You are bruising easily. Get help right away if: You break a bone. You have trouble breathing. Summary Monoclonal gammopathy of undetermined significance (MGUS) is a condition in which there is too much of a protein called monoclonal protein, or M protein, in the blood. This condition may be diagnosed with a blood test that checks for M protein. Treatment for this condition may involve having tests done regularly. Tests may include blood tests, imaging tests, and a bone marrow biopsy. This information is not intended to replace advice given to you by your health care provider. Make sure you discuss any questions you have with your health care provider. Document Revised: 08/04/2021 Document Reviewed: 08/04/2021 Elsevier Patient Education  [place].

## 2022-09-05 NOTE — Telephone Encounter (Signed)
09/05/22 Next appt scheduled and confirmed with patient

## 2022-09-06 LAB — KAPPA/LAMBDA LIGHT CHAINS
Kappa free light chain: 16.6 mg/L (ref 3.3–19.4)
Kappa, lambda light chain ratio: 0.06 — ABNORMAL LOW (ref 0.26–1.65)
Lambda free light chains: 292.5 mg/L — ABNORMAL HIGH (ref 5.7–26.3)

## 2022-09-06 LAB — BETA 2 MICROGLOBULIN, SERUM: Beta-2 Microglobulin: 2.4 mg/L (ref 0.6–2.4)

## 2022-09-07 ENCOUNTER — Encounter: Payer: Self-pay | Admitting: Hematology and Oncology

## 2022-09-07 DIAGNOSIS — D72828 Other elevated white blood cell count: Secondary | ICD-10-CM | POA: Insufficient documentation

## 2022-09-08 DIAGNOSIS — Z803 Family history of malignant neoplasm of breast: Secondary | ICD-10-CM | POA: Diagnosis not present

## 2022-09-08 DIAGNOSIS — D509 Iron deficiency anemia, unspecified: Secondary | ICD-10-CM | POA: Diagnosis not present

## 2022-09-08 DIAGNOSIS — I10 Essential (primary) hypertension: Secondary | ICD-10-CM | POA: Diagnosis not present

## 2022-09-08 DIAGNOSIS — E538 Deficiency of other specified B group vitamins: Secondary | ICD-10-CM | POA: Diagnosis not present

## 2022-09-08 DIAGNOSIS — Z800 Family history of malignant neoplasm of digestive organs: Secondary | ICD-10-CM | POA: Diagnosis not present

## 2022-09-08 DIAGNOSIS — Z8041 Family history of malignant neoplasm of ovary: Secondary | ICD-10-CM | POA: Diagnosis not present

## 2022-09-08 DIAGNOSIS — D72829 Elevated white blood cell count, unspecified: Secondary | ICD-10-CM | POA: Diagnosis not present

## 2022-09-08 DIAGNOSIS — Z8042 Family history of malignant neoplasm of prostate: Secondary | ICD-10-CM | POA: Diagnosis not present

## 2022-09-08 DIAGNOSIS — E1140 Type 2 diabetes mellitus with diabetic neuropathy, unspecified: Secondary | ICD-10-CM | POA: Diagnosis not present

## 2022-09-08 DIAGNOSIS — E1159 Type 2 diabetes mellitus with other circulatory complications: Secondary | ICD-10-CM | POA: Diagnosis not present

## 2022-09-08 DIAGNOSIS — Z79899 Other long term (current) drug therapy: Secondary | ICD-10-CM | POA: Diagnosis not present

## 2022-09-23 DIAGNOSIS — E538 Deficiency of other specified B group vitamins: Secondary | ICD-10-CM | POA: Diagnosis not present

## 2022-09-23 LAB — UPEP/UIFE/LIGHT CHAINS/TP, 24-HR UR
% BETA, Urine: 7.5 %
ALPHA 1 URINE: 4.2 %
Albumin, U: 11.2 %
Alpha 2, Urine: 4.8 %
Free Kappa Lt Chains,Ur: 11.85 mg/L (ref 1.17–86.46)
Free Kappa/Lambda Ratio: 0.05 — ABNORMAL LOW (ref 1.83–14.26)
Free Lambda Lt Chains,Ur: 263.06 mg/L — ABNORMAL HIGH (ref 0.27–15.21)
GAMMA GLOBULIN URINE: 72.3 %
M-SPIKE %, Urine: 61.1 % — ABNORMAL HIGH
M-Spike, Mg/24 Hr: 213 mg/24 hr — ABNORMAL HIGH
Total Protein, Urine-Ur/day: 348 mg/24 hr — ABNORMAL HIGH (ref 30–150)
Total Protein, Urine: 18.8 mg/dL
Total Volume: 1850

## 2022-10-03 ENCOUNTER — Inpatient Hospital Stay: Payer: Medicare HMO

## 2022-10-03 ENCOUNTER — Encounter: Payer: Self-pay | Admitting: Oncology

## 2022-10-03 ENCOUNTER — Inpatient Hospital Stay: Payer: Medicare HMO | Attending: Hematology and Oncology | Admitting: Oncology

## 2022-10-03 VITALS — BP 145/56 | HR 89 | Temp 98.6°F | Resp 18 | Ht 60.0 in | Wt 160.4 lb

## 2022-10-03 DIAGNOSIS — D472 Monoclonal gammopathy: Secondary | ICD-10-CM | POA: Diagnosis not present

## 2022-10-03 DIAGNOSIS — D500 Iron deficiency anemia secondary to blood loss (chronic): Secondary | ICD-10-CM | POA: Diagnosis not present

## 2022-10-03 DIAGNOSIS — D649 Anemia, unspecified: Secondary | ICD-10-CM | POA: Diagnosis not present

## 2022-10-03 DIAGNOSIS — R609 Edema, unspecified: Secondary | ICD-10-CM | POA: Diagnosis not present

## 2022-10-03 DIAGNOSIS — D72828 Other elevated white blood cell count: Secondary | ICD-10-CM

## 2022-10-03 DIAGNOSIS — Z79899 Other long term (current) drug therapy: Secondary | ICD-10-CM | POA: Diagnosis not present

## 2022-10-03 LAB — CBC WITH DIFFERENTIAL/PLATELET
Abs Immature Granulocytes: 0.04 10*3/uL (ref 0.00–0.07)
Basophils Absolute: 0.1 10*3/uL (ref 0.0–0.1)
Basophils Relative: 0 %
Eosinophils Absolute: 0.2 10*3/uL (ref 0.0–0.5)
Eosinophils Relative: 2 %
HCT: 42.9 % (ref 36.0–46.0)
Hemoglobin: 13.2 g/dL (ref 12.0–15.0)
Immature Granulocytes: 0 %
Lymphocytes Relative: 23 %
Lymphs Abs: 2.7 10*3/uL (ref 0.7–4.0)
MCH: 25.3 pg — ABNORMAL LOW (ref 26.0–34.0)
MCHC: 30.8 g/dL (ref 30.0–36.0)
MCV: 82.2 fL (ref 80.0–100.0)
Monocytes Absolute: 0.8 10*3/uL (ref 0.1–1.0)
Monocytes Relative: 7 %
Neutro Abs: 7.8 10*3/uL — ABNORMAL HIGH (ref 1.7–7.7)
Neutrophils Relative %: 68 %
Platelets: 285 10*3/uL (ref 150–400)
RBC: 5.22 MIL/uL — ABNORMAL HIGH (ref 3.87–5.11)
RDW: 26.1 % — ABNORMAL HIGH (ref 11.5–15.5)
WBC: 11.5 10*3/uL — ABNORMAL HIGH (ref 4.0–10.5)
nRBC: 0 % (ref 0.0–0.2)

## 2022-10-03 LAB — BRAIN NATRIURETIC PEPTIDE: B Natriuretic Peptide: 30.1 pg/mL (ref 0.0–100.0)

## 2022-10-03 LAB — FERRITIN: Ferritin: 297 ng/mL (ref 11–307)

## 2022-10-03 NOTE — Progress Notes (Signed)
[facility] Follow up Visit:  Patient Care Team: Marga Hoots, NP as PCP - General  CHIEF COMPLAINTS/PURPOSE OF CONSULTATION:   HISTORY OF PRESENTING ILLNESS: Jodi Nelson 82 y.o. 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  BI-RADS CATEGORY 1: Negative.   August 24, 2022: [place] hematology consult History notable for EGD and colonoscopy in 2013.  EGD and H. pylori negative.  Colonoscopy revealed mild colonic diverticular disease and scattered right colonic diverticula   WBC 14.3 hemoglobin 11.0 MCV 75 platelet count 424; 67 segs 25 lymphs 6 monos 2 eos Haptoglobin 373 SPEP with IEP showed 0.3 g/dL of an IgG lambda monoclonal protein IgG 674 IgA 131 IgM 22 Ferritin 7 folate 15 B12 291  FISH for BCR able negative JAK2 with reflex panel negative  Urinalysis notable for large number of leukocytes positive for nitrites rare bacteria CMP notable for glucose of 140 calcium 8.8 albumin 3.0 total protein 6.3 T. bili undetectable  February 19 -August 31 2022:  Received total of 1000 mg Venofer  September 05 2022:    Reviewed results of labs with patient.  Denies weight loss.  Her biggest concern has been neuropathy in feet and legs.  Some involvement of hands.  As a result of neuropathy ambulates with a rollator.  She also has edema for which she takes diuretics.  Feels better since receiving IV iron.  Takes ASA daily but not on any anticoagulants.    WBC 13.1 hemoglobin 10.7 MCV 78 platelet count 352; 64 segs 26 lymphs 7 monos 2 eos 1 basophil Coombs Test negative LDH 161 beta-2 microglobulin 2.4 UPEP with IFE showed loss of 348 mg of protein per day of which 213 mg was lambda light chains  October 03, 2022: Scheduled follow up for management of anemia and granulocytosis.  Feels well.  Not taking any oral iron.  Patient has had DM Type II since 2009, with FSBG's well controlled per patient.  Patient was diagnosed with neuropathy in 2003.  Discussed possible evaluation for amyloid which would entail bone marrow biopsy and fat pad aspiration.  Patient has opted for observation.    Review of Systems - Oncology  MEDICAL HISTORY: Past Medical History:  Diagnosis Date   Diabetes ([place])    type two   Diabetic neuropathy ([place])    Hyperlipidemia    Hypertension    Osteoporosis    Vitamin B 12 deficiency    Vitamin D deficiency     SURGICAL HISTORY: Past Surgical History:  Procedure Laterality Date   BLADDER SURGERY     BREAST BIOPSY Right 08/10/2011   BREAST BIOPSY Left 03/2019   clip placement   BREAST LUMPECTOMY Right    reported as benign   CATARACT EXTRACTION Bilateral    FOOT SURGERY Left    TONSILLECTOMY AND ADENOIDECTOMY      SOCIAL HISTORY: Social History   Socioeconomic History   Marital status: Married    Spouse name: Barnabas Lister   Number of children: 3   Years of education: 12   Highest education level: High school graduate  Occupational History   Occupation: retired Tourist information centre manager  Tobacco Use   Smoking status: Never   Smokeless tobacco: Never  Vaping Use   Vaping Use: Never used  Substance and Sexual Activity  Alcohol use: Never   Drug use: Never   Sexual activity: Not Currently  Other Topics Concern   Not on file  Social History Narrative   Not on file   Social Determinants of Health   Financial Resource Strain: Not on file  Food Insecurity: No Food Insecurity (08/24/2022)   Hunger Vital Sign    Worried About Running Out of Food in the Last Year: Never true    Ran Out of Food in the Last Year: Never true  Transportation Needs: No Transportation Needs (08/24/2022)   PRAPARE - Hydrologist (Medical): No    Lack of Transportation  (Non-Medical): No  Physical Activity: Not on file  Stress: Not on file  Social Connections: Not on file  Intimate Partner Violence: Not At Risk (08/24/2022)   Humiliation, Afraid, Rape, and Kick questionnaire    Fear of Current or Ex-Partner: No    Emotionally Abused: No    Physically Abused: No    Sexually Abused: No    FAMILY HISTORY Family History  Problem Relation Age of Onset   Colon cancer Mother    Prostate cancer Father    Breast cancer Sister    Kidney disease Sister     ALLERGIES:  is allergic to biaxin [clarithromycin], doxycycline, niaspan [niacin], tetracyclines & related, zithromax [azithromycin], erythromycin base, penicillins, and sulfa antibiotics.  MEDICATIONS:  Current Outpatient Medications  Medication Sig Dispense Refill   albuterol (VENTOLIN HFA) 108 (90 Base) MCG/ACT inhaler Inhale 2 puffs into the lungs every 4 (four) hours as needed for wheezing or shortness of breath.     aspirin EC 81 MG tablet Take 81 mg by mouth daily. Swallow whole.     atorvastatin (LIPITOR) 80 MG tablet Take 80 mg by mouth daily.     co-enzyme Q-10 30 MG capsule Take 100 mg by mouth daily. 2 tabs  taking 200mg  daily     furosemide (LASIX) 20 MG tablet Take 20 mg by mouth every Monday, Wednesday, and Friday.     gabapentin (NEURONTIN) 600 MG tablet Take 600 mg by mouth in the morning, at noon, in the evening, and at bedtime.     lactobacillus (FLORANEX/LACTINEX) PACK Take 1 g by mouth 3 (three) times daily with meals.     LORazepam (ATIVAN) 1 MG tablet Take 1 mg by mouth every 6 (six) hours as needed for anxiety.     metFORMIN (GLUCOPHAGE) 1000 MG tablet Take 1,000 mg by mouth 2 (two) times daily with a meal.     nortriptyline (PAMELOR) 75 MG capsule Take 75 mg by mouth at bedtime.     Omega-3 1000 MG CAPS Take by mouth.     pioglitazone (ACTOS) 15 MG tablet Take 15 mg by mouth daily.     pregabalin (LYRICA) 75 MG capsule Take 75 mg by mouth at bedtime.     Probiotic Product  (PROBIOTIC ADVANCED PO) Take by mouth.     timolol (TIMOPTIC) 0.5 % ophthalmic solution 1 drop 2 (two) times daily.     No current facility-administered medications for this visit.    PHYSICAL EXAMINATION:  ECOG PERFORMANCE STATUS: 1 - Symptomatic but completely ambulatory   Vitals:   10/03/22 1040  BP: (!) 145/56  Pulse: 89  Resp: 18  Temp: 98.6 F (37 C)  SpO2: 98%    Filed Weights   10/03/22 1040  Weight: 160 lb 6.4 oz (72.8 kg)     Physical Exam Vitals and nursing note reviewed.  Constitutional:      General: She is not in acute distress.    Appearance: Normal appearance. She is not toxic-appearing or diaphoretic.     Comments: Here with husband and daughter.  Has rollator.  Elderly appearing  HENT:     Head: Normocephalic and atraumatic.     Right Ear: External ear normal.     Left Ear: External ear normal.     Nose: Nose normal. No congestion or rhinorrhea.  Eyes:     General: No scleral icterus.    Extraocular Movements: Extraocular movements intact.     Conjunctiva/sclera: Conjunctivae normal.     Pupils: Pupils are equal, round, and reactive to light.  Cardiovascular:     Rate and Rhythm: Normal rate and regular rhythm.     Heart sounds: No murmur heard.    No friction rub. No gallop.  Abdominal:     General: Bowel sounds are normal.     Palpations: Abdomen is soft.     Tenderness: There is no abdominal tenderness. There is no guarding or rebound.  Musculoskeletal:        General: No swelling, tenderness or deformity.     Cervical back: Normal range of motion and neck supple. No rigidity or tenderness.  Lymphadenopathy:     Head:     Right side of head: No submental, submandibular, tonsillar, preauricular, posterior auricular or occipital adenopathy.     Left side of head: No submental, submandibular, tonsillar, preauricular, posterior auricular or occipital adenopathy.     Cervical: No cervical adenopathy.     Right cervical: No superficial, deep  or posterior cervical adenopathy.    Left cervical: No superficial, deep or posterior cervical adenopathy.     Upper Body:     Right upper body: No supraclavicular, axillary, pectoral or epitrochlear adenopathy.     Left upper body: No supraclavicular, axillary, pectoral or epitrochlear adenopathy.  Skin:    General: Skin is warm.     Coloration: Skin is not jaundiced or pale.     Findings: Bruising present.  Neurological:     General: No focal deficit present.     Mental Status: She is alert and oriented to person, place, and time.     Cranial Nerves: No cranial nerve deficit.     Gait: Gait abnormal.  Psychiatric:        Mood and Affect: Mood normal.        Behavior: Behavior normal.        Thought Content: Thought content normal.        Judgment: Judgment normal.     LABORATORY DATA: I have personally reviewed the data as listed:  Appointment on 09/05/2022  Component Date Value Ref Range Status   DAT, complement 09/05/2022 NEG   Final   DAT, IgG 09/05/2022    Final                   Value:NEG Performed at [facility], [place] [address], [place], [place] [address]    LDH 09/05/2022 161  98 - 192 U/L Final   Performed at [facility], [place] [address], [place], [place] [address]   Kappa free light chain 09/05/2022 16.6  3.3 - 19.4 mg/L Final   Lambda free light chains 09/05/2022 292.5 (H)  5.7 - 26.3 mg/L Final   Kappa, lambda light chain ratio 09/05/2022 0.06 (L)  0.26 - 1.65 Final   Comment: (NOTE) Performed At: [facility] [id number]  [place] [id number] Rush Farmer MD [id number]   Office Visit on 09/05/2022  Component Date Value Ref Range Status   Beta-2 Microglobulin 09/05/2022 2.4  0.6 - 2.4 mg/L Final   Comment: (NOTE) Siemens Immulite 2000 Immunochemiluminometric assay (ICMA) Values obtained with different assay methods or kits cannot be used interchangeably. Results cannot be interpreted as absolute  evidence of the presence or absence of malignant disease. Performed At: [facility] [place], [place] [id number] Rush Farmer MD [id number]    WBC 09/05/2022 13.1 (H)  4.0 - 10.5 K/uL Final   RBC 09/05/2022 4.56  3.87 - 5.11 MIL/uL Final   Hemoglobin 09/05/2022 10.7 (L)  12.0 - 15.0 g/dL Final   HCT 09/05/2022 35.5 (L)  36.0 - 46.0 % Final   MCV 09/05/2022 77.9 (L)  80.0 - 100.0 fL Final   MCH 09/05/2022 23.5 (L)  26.0 - 34.0 pg Final   MCHC 09/05/2022 30.1  30.0 - 36.0 g/dL Final   RDW 09/05/2022 23.4 (H)  11.5 - 15.5 % Final   Platelets 09/05/2022 352  150 - 400 K/uL Final   nRBC 09/05/2022 0.0  0.0 - 0.2 % Final   Neutrophils Relative % 09/05/2022 64  % Final   Neutro Abs 09/05/2022 8.6 (H)  1.7 - 7.7 K/uL Final   Lymphocytes Relative 09/05/2022 26  % Final   Lymphs Abs 09/05/2022 3.4  0.7 - 4.0 K/uL Final   Monocytes Relative 09/05/2022 7  % Final   Monocytes Absolute 09/05/2022 0.9  0.1 - 1.0 K/uL Final   Eosinophils Relative 09/05/2022 2  % Final   Eosinophils Absolute 09/05/2022 0.2  0.0 - 0.5 K/uL Final   Basophils Relative 09/05/2022 1  % Final   Basophils Absolute 09/05/2022 0.1  0.0 - 0.1 K/uL Final   Immature Granulocytes 09/05/2022 0  % Final   Abs Immature Granulocytes 09/05/2022 0.05  0.00 - 0.07 K/uL Final   Acanthocytes 09/05/2022 PRESENT   Final   Performed at [facility], [place] [address], [place], [place] [address]    RADIOGRAPHIC STUDIES: I have personally reviewed the radiological images as listed and agree with the findings in the report  No results found.  ASSESSMENT/PLAN  82 y.o. female is here because of  anemia and granulocytosis.  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, hyperlipidemia, hypertension, osteoporosis, B12 deficiency vitamin D deficiency lumpectomy for benign breast lesion  Anemia:  Etiology likely multifactorial with possible culprits being 1)  Iron deficiency from occult GI blood loss 2) CKD   February 19 -August 31 2022: Received total of 1000 mg Venofer   October 03 2022:  Hgb 13.2  Ferritin 297 (improved)  MGUS, IgG lambda  August 24 2022:  SPEP with IEP demonstrated  0.3 g/dL of an IgG lambda monoclonal protein IgG 674 IgA 131 IgM 22  September 05 2022:  LDH 161 beta-2 microglobulin 2.4 UPEP with IFE showed loss of 348 mg of protein per day of which 213 mg was lambda light chains  October 03 2022- Discussed consideration of evaluation for amyloid given renal insufficiency but patient opted for close follow up which is reasonable.    Granulocytosis:  August 24, 2022:  FISH for BCR able negative;  JAK2 with reflex panel negative.  Urinalysis notable for large number of leukocytes and nitrites  September 05, 2022: Results from previous visit indicate myeloproliferative drug disorder unlikely.  Most likely etiology is diabetes mellitus type  2   Cancer Staging  No matching staging information was found for the patient.   No problem-specific Assessment & Plan notes found for this encounter.    No orders of the defined types were placed in this encounter.   30  minutes was spent in patient care.  This included time spent preparing to see the patient (e.g., review of tests), obtaining and/or reviewing separately obtained history, counseling and educating the patient/family/caregiver, ordering medications, tests, or procedures; documenting clinical information in the electronic or other health record, independently interpreting results and communicating results to the patient/family/caregiver as well as coordination of care.       All questions were answered. The patient knows to call the clinic with any problems, questions or concerns.  This note was electronically signed.    Barbee Cough, MD  10/03/2022 10:55 AM

## 2022-10-03 NOTE — Patient Instructions (Signed)
Please try taking Solgar Gentle iron or Nature Made iron either daily or every other day

## 2022-10-10 ENCOUNTER — Encounter: Payer: Self-pay | Admitting: Hematology and Oncology

## 2022-10-13 DIAGNOSIS — H47233 Glaucomatous optic atrophy, bilateral: Secondary | ICD-10-CM | POA: Diagnosis not present

## 2022-10-13 DIAGNOSIS — H401132 Primary open-angle glaucoma, bilateral, moderate stage: Secondary | ICD-10-CM | POA: Diagnosis not present

## 2022-10-19 DIAGNOSIS — F331 Major depressive disorder, recurrent, moderate: Secondary | ICD-10-CM | POA: Diagnosis not present

## 2022-10-19 DIAGNOSIS — F411 Generalized anxiety disorder: Secondary | ICD-10-CM | POA: Diagnosis not present

## 2022-10-19 DIAGNOSIS — R69 Illness, unspecified: Secondary | ICD-10-CM | POA: Diagnosis not present

## 2022-10-24 DIAGNOSIS — R1310 Dysphagia, unspecified: Secondary | ICD-10-CM | POA: Diagnosis not present

## 2022-10-24 DIAGNOSIS — E538 Deficiency of other specified B group vitamins: Secondary | ICD-10-CM | POA: Diagnosis not present

## 2022-10-24 DIAGNOSIS — E611 Iron deficiency: Secondary | ICD-10-CM | POA: Diagnosis not present

## 2022-10-24 DIAGNOSIS — K219 Gastro-esophageal reflux disease without esophagitis: Secondary | ICD-10-CM | POA: Diagnosis not present

## 2022-10-24 DIAGNOSIS — K5790 Diverticulosis of intestine, part unspecified, without perforation or abscess without bleeding: Secondary | ICD-10-CM | POA: Diagnosis not present

## 2022-11-23 DIAGNOSIS — E559 Vitamin D deficiency, unspecified: Secondary | ICD-10-CM | POA: Diagnosis not present

## 2022-11-23 DIAGNOSIS — E538 Deficiency of other specified B group vitamins: Secondary | ICD-10-CM | POA: Diagnosis not present

## 2022-11-23 DIAGNOSIS — D509 Iron deficiency anemia, unspecified: Secondary | ICD-10-CM | POA: Diagnosis not present

## 2022-11-23 DIAGNOSIS — I10 Essential (primary) hypertension: Secondary | ICD-10-CM | POA: Diagnosis not present

## 2022-11-23 DIAGNOSIS — G629 Polyneuropathy, unspecified: Secondary | ICD-10-CM | POA: Diagnosis not present

## 2022-11-23 DIAGNOSIS — E785 Hyperlipidemia, unspecified: Secondary | ICD-10-CM | POA: Diagnosis not present

## 2022-11-23 DIAGNOSIS — E1143 Type 2 diabetes mellitus with diabetic autonomic (poly)neuropathy: Secondary | ICD-10-CM | POA: Diagnosis not present

## 2022-11-23 DIAGNOSIS — Z6831 Body mass index (BMI) 31.0-31.9, adult: Secondary | ICD-10-CM | POA: Diagnosis not present

## 2022-11-23 DIAGNOSIS — R2689 Other abnormalities of gait and mobility: Secondary | ICD-10-CM | POA: Diagnosis not present

## 2022-11-23 DIAGNOSIS — M810 Age-related osteoporosis without current pathological fracture: Secondary | ICD-10-CM | POA: Diagnosis not present

## 2022-11-24 DIAGNOSIS — Z7982 Long term (current) use of aspirin: Secondary | ICD-10-CM | POA: Diagnosis not present

## 2022-11-24 DIAGNOSIS — D509 Iron deficiency anemia, unspecified: Secondary | ICD-10-CM | POA: Diagnosis not present

## 2022-11-24 DIAGNOSIS — D649 Anemia, unspecified: Secondary | ICD-10-CM | POA: Diagnosis not present

## 2022-11-24 DIAGNOSIS — K222 Esophageal obstruction: Secondary | ICD-10-CM | POA: Diagnosis not present

## 2022-11-24 DIAGNOSIS — R1310 Dysphagia, unspecified: Secondary | ICD-10-CM | POA: Diagnosis not present

## 2022-11-24 DIAGNOSIS — Z7984 Long term (current) use of oral hypoglycemic drugs: Secondary | ICD-10-CM | POA: Diagnosis not present

## 2022-11-24 DIAGNOSIS — K219 Gastro-esophageal reflux disease without esophagitis: Secondary | ICD-10-CM | POA: Diagnosis not present

## 2023-01-03 ENCOUNTER — Inpatient Hospital Stay: Payer: Medicare HMO | Attending: Oncology | Admitting: Oncology

## 2023-01-03 ENCOUNTER — Telehealth: Payer: Self-pay | Admitting: Oncology

## 2023-01-03 ENCOUNTER — Inpatient Hospital Stay: Payer: Medicare HMO

## 2023-01-03 VITALS — BP 151/57 | HR 91 | Resp 16 | Ht 60.0 in | Wt 159.6 lb

## 2023-01-03 DIAGNOSIS — D649 Anemia, unspecified: Secondary | ICD-10-CM | POA: Insufficient documentation

## 2023-01-03 DIAGNOSIS — D500 Iron deficiency anemia secondary to blood loss (chronic): Secondary | ICD-10-CM

## 2023-01-03 DIAGNOSIS — D72828 Other elevated white blood cell count: Secondary | ICD-10-CM | POA: Diagnosis not present

## 2023-01-03 DIAGNOSIS — Z79899 Other long term (current) drug therapy: Secondary | ICD-10-CM | POA: Diagnosis not present

## 2023-01-03 DIAGNOSIS — D472 Monoclonal gammopathy: Secondary | ICD-10-CM | POA: Diagnosis not present

## 2023-01-03 LAB — COMPREHENSIVE METABOLIC PANEL
ALT: 23 U/L (ref 0–44)
AST: 22 U/L (ref 15–41)
Albumin: 3.5 g/dL (ref 3.5–5.0)
Alkaline Phosphatase: 86 U/L (ref 38–126)
Anion gap: 11 (ref 5–15)
BUN: 14 mg/dL (ref 8–23)
CO2: 25 mmol/L (ref 22–32)
Calcium: 9.1 mg/dL (ref 8.9–10.3)
Chloride: 101 mmol/L (ref 98–111)
Creatinine, Ser: 0.73 mg/dL (ref 0.44–1.00)
GFR, Estimated: >60 mL/min (ref >60)
Glucose, Bld: 112 mg/dL — ABNORMAL HIGH (ref 70–99)
Potassium: 4.1 mmol/L (ref 3.5–5.1)
Sodium: 137 mmol/L (ref 135–145)
Total Bilirubin: 0.4 mg/dL (ref 0.3–1.2)
Total Protein: 6.4 g/dL — ABNORMAL LOW (ref 6.5–8.1)

## 2023-01-03 LAB — CBC WITH DIFFERENTIAL/PLATELET
Abs Immature Granulocytes: 0.03 10*3/uL (ref 0.00–0.07)
Basophils Absolute: 0.1 10*3/uL (ref 0.0–0.1)
Basophils Relative: 1 %
Eosinophils Absolute: 0.4 10*3/uL (ref 0.0–0.5)
Eosinophils Relative: 3 %
HCT: 44.4 % (ref 36.0–46.0)
Hemoglobin: 13.9 g/dL (ref 12.0–15.0)
Immature Granulocytes: 0 %
Lymphocytes Relative: 26 %
Lymphs Abs: 3.2 10*3/uL (ref 0.7–4.0)
MCH: 27.9 pg (ref 26.0–34.0)
MCHC: 31.3 g/dL (ref 30.0–36.0)
MCV: 89.2 fL (ref 80.0–100.0)
Monocytes Absolute: 0.8 10*3/uL (ref 0.1–1.0)
Monocytes Relative: 7 %
Neutro Abs: 7.6 10*3/uL (ref 1.7–7.7)
Neutrophils Relative %: 63 %
Platelets: 301 10*3/uL (ref 150–400)
RBC: 4.98 MIL/uL (ref 3.87–5.11)
RDW: 17 % — ABNORMAL HIGH (ref 11.5–15.5)
WBC: 12 10*3/uL — ABNORMAL HIGH (ref 4.0–10.5)
nRBC: 0 % (ref 0.0–0.2)

## 2023-01-03 LAB — FERRITIN: Ferritin: 164 ng/mL (ref 11–307)

## 2023-01-03 NOTE — Telephone Encounter (Signed)
Patient has been scheduled. Aware of appt date and time   Message Received: Today Jodi Nelson, Jodi Nelson, CMA sent to [employer] Scheduling 4 month office visit with labs

## 2023-01-03 NOTE — Progress Notes (Signed)
[facility] Cancer Follow up Visit:  Patient Care Team: Doran Stabler, NP as PCP - General  CHIEF COMPLAINTS/PURPOSE OF CONSULTATION:   HISTORY OF PRESENTING ILLNESS: Jodi Nelson 82 y.o. 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  BI-RADS CATEGORY 1: Negative.   August 24, 2022:  hematology consult History notable for EGD and colonoscopy in 2013.  EGD and H. pylori negative.  Colonoscopy revealed mild colonic diverticular disease and scattered right colonic diverticula   WBC 14.3 hemoglobin 11.0 MCV 75 platelet count 424; 67 segs 25 lymphs 6 monos 2 eos Haptoglobin 373 SPEP with IEP showed 0.3 g/dL of an IgG lambda monoclonal protein IgG 674 IgA 131 IgM 22 Ferritin 7 folate 15 B12 291  FISH for BCR able negative JAK2 with reflex panel negative  Urinalysis notable for large number of leukocytes positive for nitrites rare bacteria CMP notable for glucose of 140 calcium 8.8 albumin 3.0 total protein 6.3 T. bili undetectable  February 19 -August 31 2022:  Received total of 1000 mg Venofer  September 05 2022:    Reviewed results of labs with patient.  Denies weight loss.  Her biggest concern has been neuropathy in feet and legs.  Some involvement of hands.  As a result of neuropathy ambulates with a rollator.  She also has edema for which she takes diuretics.  Feels better since receiving IV iron.  Takes ASA daily but not on any anticoagulants.    WBC 13.1 hemoglobin 10.7 MCV 78 platelet count 352; 64 segs 26 lymphs 7 monos 2 eos 1 basophil Coombs Test negative LDH 161 beta-2 microglobulin 2.4 UPEP with IFE showed loss of 348 mg of protein per day of which 213 mg was lambda light chains  October 03, 2022:  Feels well.  Not taking any oral iron.  Patient has had DM Type II since 2009, with FSBG's well controlled per patient.  Patient was diagnosed with neuropathy in 2003.  Discussed possible evaluation for amyloid which would entail bone marrow biopsy and fat pad aspiration.  Patient has opted for observation.    WBC 11.5 Hgb 13.2  PLT 285; 68 segs 23 lymphs 7 mono 2 eos  BNP 30.1 Ferritin 297  January 03 2023:  Scheduled follow up for management of anemia and granulocytosis.  Feels pretty good.  Continues to experience problems with peripheral neuropathy for which she takes gabapentin and lyrica.  DM under good control.  Underwent EGD since last visit and was noted to have esophageal stricture which was dilated.  Swallowing has improved following the dilation  WBC 12.0 hemoglobin 13.9 platelet count 301; 63 segs 26 lymphs 7 monos 3 eos 1 basophil.  ANC 7.6 Ferritin 164  Review of Systems - Oncology  MEDICAL HISTORY: Past Medical History:  Diagnosis Date   Diabetes (HCC)    type two   Diabetic neuropathy (HCC)    Hyperlipidemia    Hypertension    Osteoporosis    Vitamin B 12 deficiency    Vitamin D deficiency     SURGICAL HISTORY: Past Surgical History:  Procedure Laterality Date   BLADDER SURGERY     BREAST BIOPSY Right 08/10/2011   BREAST BIOPSY Left 03/2019   clip placement   BREAST LUMPECTOMY Right    reported as benign  CATARACT EXTRACTION Bilateral    FOOT SURGERY Left    TONSILLECTOMY AND ADENOIDECTOMY      SOCIAL HISTORY: Social History   Socioeconomic History   Marital status: Married    Spouse name: Ree Kida   Number of children: 3   Years of education: 12   Highest education level: High school graduate  Occupational History   Occupation: retired Press photographer  Tobacco Use   Smoking status: Never   Smokeless tobacco: Never  Building services engineer Use: Never used  Substance and Sexual Activity   Alcohol use: Never   Drug use: Never   Sexual activity: Not  Currently  Other Topics Concern   Not on file  Social History Narrative   Not on file   Social Determinants of Health   Financial Resource Strain: Not on file  Food Insecurity: No Food Insecurity (08/24/2022)   Hunger Vital Sign    Worried About Running Out of Food in the Last Year: Never true    Ran Out of Food in the Last Year: Never true  Transportation Needs: No Transportation Needs (08/24/2022)   PRAPARE - Administrator, Civil Service (Medical): No    Lack of Transportation (Non-Medical): No  Physical Activity: Not on file  Stress: Not on file  Social Connections: Not on file  Intimate Partner Violence: Not At Risk (08/24/2022)   Humiliation, Afraid, Rape, and Kick questionnaire    Fear of Current or Ex-Partner: No    Emotionally Abused: No    Physically Abused: No    Sexually Abused: No    FAMILY HISTORY Family History  Problem Relation Age of Onset   Colon cancer Mother    Prostate cancer Father    Breast cancer Sister    Kidney disease Sister     ALLERGIES:  is allergic to biaxin [clarithromycin], doxycycline, niaspan [niacin], tetracyclines & related, zithromax [azithromycin], erythromycin base, penicillins, and sulfa antibiotics.  MEDICATIONS:  Current Outpatient Medications  Medication Sig Dispense Refill   albuterol (VENTOLIN HFA) 108 (90 Base) MCG/ACT inhaler Inhale 2 puffs into the lungs every 4 (four) hours as needed for wheezing or shortness of breath.     aspirin EC 81 MG tablet Take 81 mg by mouth daily. Swallow whole.     atorvastatin (LIPITOR) 80 MG tablet Take 80 mg by mouth daily.     co-enzyme Q-10 30 MG capsule Take 100 mg by mouth daily. 2 tabs  taking 200mg  daily     furosemide (LASIX) 20 MG tablet Take 20 mg by mouth every Monday, Wednesday, and Friday.     gabapentin (NEURONTIN) 600 MG tablet Take 600 mg by mouth in the morning, at noon, in the evening, and at bedtime.     lactobacillus (FLORANEX/LACTINEX) PACK Take 1 g by mouth 3  (three) times daily with meals.     LORazepam (ATIVAN) 1 MG tablet Take 1 mg by mouth every 6 (six) hours as needed for anxiety.     metFORMIN (GLUCOPHAGE) 1000 MG tablet Take 1,000 mg by mouth 2 (two) times daily with a meal.     nortriptyline (PAMELOR) 75 MG capsule Take 75 mg by mouth at bedtime.     Omega-3 1000 MG CAPS Take by mouth.     pioglitazone (ACTOS) 15 MG tablet Take 15 mg by mouth daily.     pregabalin (LYRICA) 75 MG capsule Take 75 mg by mouth at bedtime.     Probiotic Product (PROBIOTIC ADVANCED PO) Take by mouth.  timolol (TIMOPTIC) 0.5 % ophthalmic solution 1 drop 2 (two) times daily.     No current facility-administered medications for this visit.    PHYSICAL EXAMINATION:  ECOG PERFORMANCE STATUS: 1 - Symptomatic but completely ambulatory   There were no vitals filed for this visit.   There were no vitals filed for this visit.    Physical Exam Vitals and nursing note reviewed.  Constitutional:      General: She is not in acute distress.    Appearance: Normal appearance. She is not toxic-appearing or diaphoretic.     Comments: Here with husband and daughter.  Has rollator.  Elderly appearing  HENT:     Head: Normocephalic and atraumatic.     Right Ear: External ear normal.     Left Ear: External ear normal.     Nose: Nose normal. No congestion or rhinorrhea.  Eyes:     General: No scleral icterus.    Extraocular Movements: Extraocular movements intact.     Conjunctiva/sclera: Conjunctivae normal.     Pupils: Pupils are equal, round, and reactive to light.  Cardiovascular:     Rate and Rhythm: Normal rate and regular rhythm.     Heart sounds: No murmur heard.    No friction rub. No gallop.  Abdominal:     General: Bowel sounds are normal.     Palpations: Abdomen is soft.     Tenderness: There is no abdominal tenderness. There is no guarding or rebound.  Musculoskeletal:        General: No swelling, tenderness or deformity.     Cervical back:  Normal range of motion and neck supple. No rigidity or tenderness.  Lymphadenopathy:     Head:     Right side of head: No submental, submandibular, tonsillar, preauricular, posterior auricular or occipital adenopathy.     Left side of head: No submental, submandibular, tonsillar, preauricular, posterior auricular or occipital adenopathy.     Cervical: No cervical adenopathy.     Right cervical: No superficial, deep or posterior cervical adenopathy.    Left cervical: No superficial, deep or posterior cervical adenopathy.     Upper Body:     Right upper body: No supraclavicular, axillary, pectoral or epitrochlear adenopathy.     Left upper body: No supraclavicular, axillary, pectoral or epitrochlear adenopathy.  Skin:    General: Skin is warm.     Coloration: Skin is not jaundiced or pale.     Findings: Bruising present.  Neurological:     General: No focal deficit present.     Mental Status: She is alert and oriented to person, place, and time.     Cranial Nerves: No cranial nerve deficit.     Gait: Gait abnormal.  Psychiatric:        Mood and Affect: Mood normal.        Behavior: Behavior normal.        Thought Content: Thought content normal.        Judgment: Judgment normal.    LABORATORY DATA: I have personally reviewed the data as listed:  No visits with results within 1 Month(s) from this visit.  Latest known visit with results is:  Appointment on 10/03/2022  Component Date Value Ref Range Status   B Natriuretic Peptide 10/03/2022 30.1  0.0 - 100.0 pg/mL Final   Performed at [facility], [address] [place], [place] [address]   Ferritin 10/03/2022 297  11 - 307 ng/mL Final   Performed at [facility], [address] [place], [place] [address]    RADIOGRAPHIC STUDIES: I have personally reviewed the radiological images as listed and agree with the findings in the report  No results found.  ASSESSMENT/PLAN  82 y.o. female is  here because of  anemia and granulocytosis.  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, hyperlipidemia, hypertension, osteoporosis, B12 deficiency vitamin D deficiency lumpectomy for benign breast lesion  Anemia:  Etiology likely multifactorial with possible culprits being 1) Iron deficiency from occult GI blood loss 2) CKD   February 19 -August 31 2022: Received total of 1000 mg Venofer   October 03 2022:  Hgb 13.2  Ferritin 297 (improved)  January 03 2023--   Hgb 13.9; ferritin 164  MGUS, IgG lambda  August 24 2022:  SPEP with IEP demonstrated  0.3 g/dL of an IgG lambda monoclonal protein IgG 674 IgA 131 IgM 22  September 05 2022:  LDH 161 beta-2 microglobulin 2.4 UPEP with IFE showed loss of 348 mg of protein per day of which 213 mg was lambda light chains  October 03 2022- Discussed consideration of evaluation for amyloid given renal insufficiency but patient opted for close follow up which is reasonable.    January 03 2023-  Obtain repeat SPEP with IEP, free light chains  Granulocytosis:  August 24, 2022:  FISH for BCR able negative;  JAK2 with reflex panel negative.  Urinalysis notable for large number of leukocytes and nitrites  September 05, 2022: Results from previous visit indicate myeloproliferative disorder unlikely.  Most likely etiology is diabetes mellitus type 2  January 03 2024-  WBC elevated but ANC normal   Cancer Staging  No matching staging information was found for the patient.   No problem-specific Assessment & Plan notes found for this encounter.    No orders of the defined types were placed in this encounter.   30  minutes was spent in patient care.  This included time spent preparing to see the patient (e.g., review of tests), obtaining and/or reviewing separately obtained history, counseling and educating the patient/family/caregiver, ordering medications, tests, or procedures; documenting clinical information in the electronic  or other health record, independently interpreting results and communicating results to the patient/family/caregiver as well as coordination of care.       All questions were answered. The patient knows to call the clinic with any problems, questions or concerns.  This note was electronically signed.    Loni Muse, MD  01/03/2023 8:33 AM

## 2023-01-04 LAB — KAPPA/LAMBDA LIGHT CHAINS
Kappa free light chain: 17.5 mg/L (ref 3.3–19.4)
Kappa, lambda light chain ratio: 0.06 — ABNORMAL LOW (ref 0.26–1.65)
Lambda free light chains: 294.8 mg/L — ABNORMAL HIGH (ref 5.7–26.3)

## 2023-01-09 LAB — MULTIPLE MYELOMA PANEL, SERUM
Albumin SerPl Elph-Mcnc: 3.3 g/dL (ref 2.9–4.4)
Albumin/Glob SerPl: 1.2 (ref 0.7–1.7)
Alpha 1: 0.2 g/dL (ref 0.0–0.4)
Alpha2 Glob SerPl Elph-Mcnc: 1.1 g/dL — ABNORMAL HIGH (ref 0.4–1.0)
B-Globulin SerPl Elph-Mcnc: 0.9 g/dL (ref 0.7–1.3)
Gamma Glob SerPl Elph-Mcnc: 0.7 g/dL (ref 0.4–1.8)
Globulin, Total: 2.9 g/dL (ref 2.2–3.9)
IgA: 125 mg/dL (ref 64–422)
IgG (Immunoglobin G), Serum: 865 mg/dL (ref 586–1602)
IgM (Immunoglobulin M), Srm: 18 mg/dL — ABNORMAL LOW (ref 26–217)
M Protein SerPl Elph-Mcnc: 0.5 g/dL — ABNORMAL HIGH
Total Protein ELP: 6.2 g/dL (ref 6.0–8.5)

## 2023-01-10 ENCOUNTER — Telehealth: Payer: Self-pay

## 2023-01-10 DIAGNOSIS — H4010X2 Unspecified open-angle glaucoma, moderate stage: Secondary | ICD-10-CM | POA: Diagnosis not present

## 2023-01-10 DIAGNOSIS — H47233 Glaucomatous optic atrophy, bilateral: Secondary | ICD-10-CM | POA: Diagnosis not present

## 2023-01-10 DIAGNOSIS — H401132 Primary open-angle glaucoma, bilateral, moderate stage: Secondary | ICD-10-CM | POA: Diagnosis not present

## 2023-01-10 NOTE — Patient Outreach (Signed)
  Care Coordination   Initial Visit Note   01/10/2023 Name: Jodi Nelson MRN: [id number] DOB: 07-07-1941  Jodi Nelson is a 82 y.o. year old female who sees Doran Stabler, NP for primary care. I spoke with  [name] by phone today.  What matters to the patients health and wellness today?  Incoming call from patient who reports that she is doing well. I explained and offered [facility] care coordination program.  Patient declined.     SDOH assessments and interventions completed:  No     Care Coordination Interventions:  No, not indicated   Follow up plan: No further intervention required.   Encounter Outcome:  Pt. Refused   Rowe Pavy, RN, BSN, CEN [facility] [employer] [phone]

## 2023-01-10 NOTE — Patient Outreach (Signed)
  Care Coordination   01/10/2023 Name: Jodi Nelson MRN: [id number] DOB: 1941/02/02   Care Coordination Outreach Attempts:  An unsuccessful telephone outreach was attempted today to offer the patient information about available care coordination services.  Follow Up Plan:  Additional outreach attempts will be made to offer the patient care coordination information and services.   Encounter Outcome:  No Answer   Care Coordination Interventions:  No, not indicated    Rowe Pavy, RN, BSN, [facility] [employer] [phone]

## 2023-01-16 ENCOUNTER — Encounter: Payer: Self-pay | Admitting: Hematology and Oncology

## 2023-01-18 DIAGNOSIS — F331 Major depressive disorder, recurrent, moderate: Secondary | ICD-10-CM | POA: Diagnosis not present

## 2023-01-18 DIAGNOSIS — F411 Generalized anxiety disorder: Secondary | ICD-10-CM | POA: Diagnosis not present

## 2023-01-24 DIAGNOSIS — E538 Deficiency of other specified B group vitamins: Secondary | ICD-10-CM | POA: Diagnosis not present

## 2023-01-26 DIAGNOSIS — K219 Gastro-esophageal reflux disease without esophagitis: Secondary | ICD-10-CM | POA: Diagnosis not present

## 2023-01-26 DIAGNOSIS — K222 Esophageal obstruction: Secondary | ICD-10-CM | POA: Diagnosis not present

## 2023-02-21 DIAGNOSIS — G629 Polyneuropathy, unspecified: Secondary | ICD-10-CM | POA: Diagnosis not present

## 2023-02-21 DIAGNOSIS — Z6831 Body mass index (BMI) 31.0-31.9, adult: Secondary | ICD-10-CM | POA: Diagnosis not present

## 2023-02-21 DIAGNOSIS — E1143 Type 2 diabetes mellitus with diabetic autonomic (poly)neuropathy: Secondary | ICD-10-CM | POA: Diagnosis not present

## 2023-02-21 DIAGNOSIS — E785 Hyperlipidemia, unspecified: Secondary | ICD-10-CM | POA: Diagnosis not present

## 2023-02-21 DIAGNOSIS — J329 Chronic sinusitis, unspecified: Secondary | ICD-10-CM | POA: Diagnosis not present

## 2023-02-21 DIAGNOSIS — D509 Iron deficiency anemia, unspecified: Secondary | ICD-10-CM | POA: Diagnosis not present

## 2023-02-21 DIAGNOSIS — M810 Age-related osteoporosis without current pathological fracture: Secondary | ICD-10-CM | POA: Diagnosis not present

## 2023-02-21 DIAGNOSIS — I10 Essential (primary) hypertension: Secondary | ICD-10-CM | POA: Diagnosis not present

## 2023-02-21 DIAGNOSIS — E538 Deficiency of other specified B group vitamins: Secondary | ICD-10-CM | POA: Diagnosis not present

## 2023-02-21 DIAGNOSIS — J45909 Unspecified asthma, uncomplicated: Secondary | ICD-10-CM | POA: Diagnosis not present

## 2023-02-21 DIAGNOSIS — E559 Vitamin D deficiency, unspecified: Secondary | ICD-10-CM | POA: Diagnosis not present

## 2023-02-21 DIAGNOSIS — R2689 Other abnormalities of gait and mobility: Secondary | ICD-10-CM | POA: Diagnosis not present

## 2023-02-22 DIAGNOSIS — Z9181 History of falling: Secondary | ICD-10-CM | POA: Diagnosis not present

## 2023-02-22 DIAGNOSIS — Z0000 Encounter for general adult medical examination without abnormal findings: Secondary | ICD-10-CM | POA: Diagnosis not present

## 2023-02-28 DIAGNOSIS — G629 Polyneuropathy, unspecified: Secondary | ICD-10-CM | POA: Diagnosis not present

## 2023-04-03 DIAGNOSIS — E538 Deficiency of other specified B group vitamins: Secondary | ICD-10-CM | POA: Diagnosis not present

## 2023-04-04 DIAGNOSIS — Z7984 Long term (current) use of oral hypoglycemic drugs: Secondary | ICD-10-CM | POA: Diagnosis not present

## 2023-04-04 DIAGNOSIS — E119 Type 2 diabetes mellitus without complications: Secondary | ICD-10-CM | POA: Diagnosis not present

## 2023-04-04 DIAGNOSIS — H47233 Glaucomatous optic atrophy, bilateral: Secondary | ICD-10-CM | POA: Diagnosis not present

## 2023-04-04 DIAGNOSIS — H5203 Hypermetropia, bilateral: Secondary | ICD-10-CM | POA: Diagnosis not present

## 2023-04-04 DIAGNOSIS — Z961 Presence of intraocular lens: Secondary | ICD-10-CM | POA: Diagnosis not present

## 2023-04-04 DIAGNOSIS — Z9849 Cataract extraction status, unspecified eye: Secondary | ICD-10-CM | POA: Diagnosis not present

## 2023-04-04 DIAGNOSIS — H524 Presbyopia: Secondary | ICD-10-CM | POA: Diagnosis not present

## 2023-04-04 DIAGNOSIS — H52223 Regular astigmatism, bilateral: Secondary | ICD-10-CM | POA: Diagnosis not present

## 2023-04-04 DIAGNOSIS — H401132 Primary open-angle glaucoma, bilateral, moderate stage: Secondary | ICD-10-CM | POA: Diagnosis not present

## 2023-04-13 DIAGNOSIS — R0981 Nasal congestion: Secondary | ICD-10-CM | POA: Diagnosis not present

## 2023-04-13 DIAGNOSIS — R051 Acute cough: Secondary | ICD-10-CM | POA: Diagnosis not present

## 2023-04-20 DIAGNOSIS — F411 Generalized anxiety disorder: Secondary | ICD-10-CM | POA: Diagnosis not present

## 2023-04-20 DIAGNOSIS — F331 Major depressive disorder, recurrent, moderate: Secondary | ICD-10-CM | POA: Diagnosis not present

## 2023-05-09 ENCOUNTER — Inpatient Hospital Stay: Payer: Medicare HMO | Attending: Oncology

## 2023-05-09 ENCOUNTER — Inpatient Hospital Stay: Payer: Medicare HMO | Admitting: Oncology

## 2023-05-09 VITALS — BP 157/56 | HR 79 | Temp 97.6°F | Resp 18 | Ht 60.0 in | Wt 157.2 lb

## 2023-05-09 DIAGNOSIS — D472 Monoclonal gammopathy: Secondary | ICD-10-CM | POA: Diagnosis not present

## 2023-05-09 DIAGNOSIS — D72828 Other elevated white blood cell count: Secondary | ICD-10-CM

## 2023-05-09 DIAGNOSIS — M810 Age-related osteoporosis without current pathological fracture: Secondary | ICD-10-CM | POA: Insufficient documentation

## 2023-05-09 DIAGNOSIS — D500 Iron deficiency anemia secondary to blood loss (chronic): Secondary | ICD-10-CM

## 2023-05-09 DIAGNOSIS — D649 Anemia, unspecified: Secondary | ICD-10-CM | POA: Insufficient documentation

## 2023-05-09 DIAGNOSIS — Z79899 Other long term (current) drug therapy: Secondary | ICD-10-CM | POA: Diagnosis not present

## 2023-05-09 DIAGNOSIS — E559 Vitamin D deficiency, unspecified: Secondary | ICD-10-CM | POA: Diagnosis not present

## 2023-05-09 LAB — COMPREHENSIVE METABOLIC PANEL
ALT: 19 U/L (ref 0–44)
AST: 21 U/L (ref 15–41)
Albumin: 3.4 g/dL — ABNORMAL LOW (ref 3.5–5.0)
Alkaline Phosphatase: 73 U/L (ref 38–126)
Anion gap: 10 (ref 5–15)
BUN: 16 mg/dL (ref 8–23)
CO2: 29 mmol/L (ref 22–32)
Calcium: 9.7 mg/dL (ref 8.9–10.3)
Chloride: 100 mmol/L (ref 98–111)
Creatinine, Ser: 0.62 mg/dL (ref 0.44–1.00)
GFR, Estimated: >60 mL/min (ref >60)
Glucose, Bld: 140 mg/dL — ABNORMAL HIGH (ref 70–99)
Potassium: 4.5 mmol/L (ref 3.5–5.1)
Sodium: 139 mmol/L (ref 135–145)
Total Bilirubin: 0.5 mg/dL (ref 0.3–1.2)
Total Protein: 6.3 g/dL — ABNORMAL LOW (ref 6.5–8.1)

## 2023-05-09 LAB — CBC WITH DIFFERENTIAL/PLATELET
Abs Immature Granulocytes: 0.02 10*3/uL (ref 0.00–0.07)
Basophils Absolute: 0 10*3/uL (ref 0.0–0.1)
Basophils Relative: 0 %
Eosinophils Absolute: 0.4 10*3/uL (ref 0.0–0.5)
Eosinophils Relative: 3 %
HCT: 43 % (ref 36.0–46.0)
Hemoglobin: 13.5 g/dL (ref 12.0–15.0)
Immature Granulocytes: 0 %
Lymphocytes Relative: 28 %
Lymphs Abs: 3 10*3/uL (ref 0.7–4.0)
MCH: 28.7 pg (ref 26.0–34.0)
MCHC: 31.4 g/dL (ref 30.0–36.0)
MCV: 91.3 fL (ref 80.0–100.0)
Monocytes Absolute: 0.8 10*3/uL (ref 0.1–1.0)
Monocytes Relative: 8 %
Neutro Abs: 6.5 10*3/uL (ref 1.7–7.7)
Neutrophils Relative %: 61 %
Platelets: 323 10*3/uL (ref 150–400)
RBC: 4.71 MIL/uL (ref 3.87–5.11)
RDW: 15.4 % (ref 11.5–15.5)
WBC: 10.7 10*3/uL — ABNORMAL HIGH (ref 4.0–10.5)
nRBC: 0 % (ref 0.0–0.2)

## 2023-05-09 LAB — FOLATE: Folate: 18.9 ng/mL (ref >5.9)

## 2023-05-09 LAB — VITAMIN B12: Vitamin B-12: 313 pg/mL (ref 180–914)

## 2023-05-09 LAB — FERRITIN: Ferritin: 138 ng/mL (ref 11–307)

## 2023-05-09 NOTE — Progress Notes (Signed)
[facility] Cancer Follow up Visit:  Patient Care Team: Doran Stabler, NP as PCP - General  CHIEF COMPLAINTS/PURPOSE OF CONSULTATION:   HISTORY OF PRESENTING ILLNESS: Jodi Nelson 82 y.o. 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  BI-RADS CATEGORY 1: Negative.   August 24, 2022:  hematology consult History notable for EGD and colonoscopy in 2013.  EGD and H. pylori negative.  Colonoscopy revealed mild colonic diverticular disease and scattered right colonic diverticula   WBC 14.3 hemoglobin 11.0 MCV 75 platelet count 424; 67 segs 25 lymphs 6 monos 2 eos Haptoglobin 373 SPEP with IEP showed 0.3 g/dL of an IgG lambda monoclonal protein IgG 674 IgA 131 IgM 22 Ferritin 7 folate 15 B12 291  FISH for BCR able negative JAK2 with reflex panel negative  Urinalysis notable for large number of leukocytes positive for nitrites rare bacteria CMP notable for glucose of 140 calcium 8.8 albumin 3.0 total protein 6.3 T. bili undetectable  February 19 -August 31 2022:  Received total of 1000 mg Venofer  September 05 2022:    Reviewed results of labs with patient.  Denies weight loss.  Her biggest concern has been neuropathy in feet and legs.  Some involvement of hands.  As a result of neuropathy ambulates with a rollator.  She also has edema for which she takes diuretics.  Feels better since receiving IV iron.  Takes ASA daily but not on any anticoagulants.    WBC 13.1 hemoglobin 10.7 MCV 78 platelet count 352; 64 segs 26 lymphs 7 monos 2 eos 1 basophil Coombs Test negative LDH 161 beta-2 microglobulin 2.4 UPEP with IFE showed loss of 348 mg of protein per day of which 213 mg was lambda light chains  October 03, 2022:  Feels well.  Not taking any oral iron.  Patient has had DM Type II since 2009, with FSBG's well controlled per patient.  Patient was diagnosed with neuropathy in 2003.  Discussed possible evaluation for amyloid which would entail bone marrow biopsy and fat pad aspiration.  Patient has opted for observation.    WBC 11.5 Hgb 13.2  PLT 285; 68 segs 23 lymphs 7 mono 2 eos  BNP 30.1 Ferritin 297  January 03 2023:   Feels pretty good.  Continues to experience problems with peripheral neuropathy for which she takes gabapentin and lyrica.  DM under good control.  Underwent EGD since last visit and was noted to have esophageal stricture which was dilated.  Swallowing has improved following the dilation  WBC 12.0 hemoglobin 13.9 platelet count 301; 63 segs 26 lymphs 7 monos 3 eos 1 basophil.  ANC 7.6 Ferritin 164 creatinine 0.73 SPEP with IEP demonstrated 0.5 g/dL of an IgG lambda monoclonal protein.  Serum kappa 17.5 lambda 295 ratio 0.06 IgG 865 IgA 125 IgM 18  May 09 2023:  Scheduled follow up for management of anemia and granulocytosis.   Reviewed results of labs with patient and family.  Feels well.  No dysphagia/odynophagia.  Not taking oral iron.  Not fatigued, nor having ice pica.  Recently had stool test which per patient was heme negative.    WBC 10.7 hemoglobin 13.5 platelet count 323; 61 segs 20 lymphs 8 monos 3 eos  Review of Systems - Oncology  MEDICAL HISTORY: Past Medical  History:  Diagnosis Date   Diabetes (HCC)    type two   Diabetic neuropathy (HCC)    Hyperlipidemia    Hypertension    Osteoporosis    Vitamin B 12 deficiency    Vitamin D deficiency     SURGICAL HISTORY: Past Surgical History:  Procedure Laterality Date   BLADDER SURGERY     BREAST BIOPSY Right 08/10/2011   BREAST BIOPSY Left 03/2019   clip placement   BREAST LUMPECTOMY Right    reported as benign   CATARACT EXTRACTION Bilateral    FOOT SURGERY Left    TONSILLECTOMY AND ADENOIDECTOMY       SOCIAL HISTORY: Social History   Socioeconomic History   Marital status: Married    Spouse name: Ree Kida   Number of children: 3   Years of education: 12   Highest education level: High school graduate  Occupational History   Occupation: retired Press photographer  Tobacco Use   Smoking status: Never   Smokeless tobacco: Never  Vaping Use   Vaping status: Never Used  Substance and Sexual Activity   Alcohol use: Never   Drug use: Never   Sexual activity: Not Currently  Other Topics Concern   Not on file  Social History Narrative   Not on file   Social Determinants of Health   Financial Resource Strain: Not on file  Food Insecurity: No Food Insecurity (08/24/2022)   Hunger Vital Sign    Worried About Running Out of Food in the Last Year: Never true    Ran Out of Food in the Last Year: Never true  Transportation Needs: No Transportation Needs (08/24/2022)   PRAPARE - Administrator, Civil Service (Medical): No    Lack of Transportation (Non-Medical): No  Physical Activity: Not on file  Stress: Not on file  Social Connections: Not on file  Intimate Partner Violence: Not At Risk (08/24/2022)   Humiliation, Afraid, Rape, and Kick questionnaire    Fear of Current or Ex-Partner: No    Emotionally Abused: No    Physically Abused: No    Sexually Abused: No    FAMILY HISTORY Family History  Problem Relation Age of Onset   Colon cancer Mother    Prostate cancer Father    Breast cancer Sister    Kidney disease Sister     ALLERGIES:  is allergic to biaxin [clarithromycin], doxycycline, niaspan [niacin], tetracyclines & related, zithromax [azithromycin], erythromycin base, penicillins, and sulfa antibiotics.  MEDICATIONS:  Current Outpatient Medications  Medication Sig Dispense Refill   albuterol (VENTOLIN HFA) 108 (90 Base) MCG/ACT inhaler Inhale 2 puffs into the lungs every 4 (four) hours as needed for wheezing or shortness of breath.     aspirin EC 81 MG tablet Take  81 mg by mouth daily. Swallow whole.     atorvastatin (LIPITOR) 80 MG tablet Take 80 mg by mouth daily.     co-enzyme Q-10 30 MG capsule Take 100 mg by mouth daily. 2 tabs  taking 200mg  daily     furosemide (LASIX) 20 MG tablet Take 20 mg by mouth every Monday, Wednesday, and Friday.     gabapentin (NEURONTIN) 600 MG tablet Take 600 mg by mouth in the morning, at noon, in the evening, and at bedtime.     lactobacillus (FLORANEX/LACTINEX) PACK Take 1 g by mouth 3 (three) times daily with meals.     LORazepam (ATIVAN) 1 MG tablet Take 1 mg by mouth every 6 (six) hours as needed for anxiety.  metFORMIN (GLUCOPHAGE) 1000 MG tablet Take 1,000 mg by mouth 2 (two) times daily with a meal.     nortriptyline (PAMELOR) 75 MG capsule Take 75 mg by mouth at bedtime.     Omega-3 1000 MG CAPS Take by mouth.     pioglitazone (ACTOS) 15 MG tablet Take 15 mg by mouth daily.     pregabalin (LYRICA) 75 MG capsule Take 75 mg by mouth at bedtime.     Probiotic Product (PROBIOTIC ADVANCED PO) Take by mouth.     timolol (TIMOPTIC) 0.5 % ophthalmic solution 1 drop 2 (two) times daily.     No current facility-administered medications for this visit.    PHYSICAL EXAMINATION:  ECOG PERFORMANCE STATUS: 1 - Symptomatic but completely ambulatory   Vitals:   05/09/23 1115  BP: (!) 157/56  Pulse: 79  Resp: 18  Temp: 97.6 F (36.4 C)  SpO2: 97%    Filed Weights   05/09/23 1115  Weight: 157 lb 3.2 oz (71.3 kg)     Physical Exam Vitals and nursing note reviewed.  Constitutional:      General: She is not in acute distress.    Appearance: Normal appearance. She is not toxic-appearing or diaphoretic.     Comments: Here with husband and daughter.  Has rollator.  Elderly appearing  HENT:     Head: Normocephalic and atraumatic.     Right Ear: External ear normal.     Left Ear: External ear normal.     Nose: Nose normal. No congestion or rhinorrhea.  Eyes:     General: No scleral icterus.    Extraocular  Movements: Extraocular movements intact.     Conjunctiva/sclera: Conjunctivae normal.     Pupils: Pupils are equal, round, and reactive to light.  Cardiovascular:     Rate and Rhythm: Normal rate and regular rhythm.     Heart sounds: No murmur heard.    No friction rub. No gallop.  Abdominal:     General: Bowel sounds are normal.     Palpations: Abdomen is soft.     Tenderness: There is no abdominal tenderness. There is no guarding or rebound.  Musculoskeletal:        General: No swelling, tenderness or deformity.     Cervical back: Normal range of motion and neck supple. No rigidity or tenderness.  Lymphadenopathy:     Head:     Right side of head: No submental, submandibular, tonsillar, preauricular, posterior auricular or occipital adenopathy.     Left side of head: No submental, submandibular, tonsillar, preauricular, posterior auricular or occipital adenopathy.     Cervical: No cervical adenopathy.     Right cervical: No superficial, deep or posterior cervical adenopathy.    Left cervical: No superficial, deep or posterior cervical adenopathy.     Upper Body:     Right upper body: No supraclavicular, axillary, pectoral or epitrochlear adenopathy.     Left upper body: No supraclavicular, axillary, pectoral or epitrochlear adenopathy.  Skin:    General: Skin is warm.     Coloration: Skin is not jaundiced or pale.     Findings: Bruising present.  Neurological:     General: No focal deficit present.     Mental Status: She is alert and oriented to person, place, and time.     Cranial Nerves: No cranial nerve deficit.     Gait: Gait abnormal.  Psychiatric:        Mood and Affect: Mood normal.  Behavior: Behavior normal.        Thought Content: Thought content normal.        Judgment: Judgment normal.     LABORATORY DATA: I have personally reviewed the data as listed:  No visits with results within 1 Month(s) from this visit.  Latest known visit with results is:   Office Visit on 01/03/2023  Component Date Value Ref Range Status   WBC 01/03/2023 12.0 (H)  4.0 - 10.5 K/uL Final   RBC 01/03/2023 4.98  3.87 - 5.11 MIL/uL Final   Hemoglobin 01/03/2023 13.9  12.0 - 15.0 g/dL Final   HCT 78/29/5621 44.4  36.0 - 46.0 % Final   MCV 01/03/2023 89.2  80.0 - 100.0 fL Final   MCH 01/03/2023 27.9  26.0 - 34.0 pg Final   MCHC 01/03/2023 31.3  30.0 - 36.0 g/dL Final   RDW 30/86/5784 17.0 (H)  11.5 - 15.5 % Final   Platelets 01/03/2023 301  150 - 400 K/uL Final   nRBC 01/03/2023 0.0  0.0 - 0.2 % Final   Neutrophils Relative % 01/03/2023 63  % Final   Neutro Abs 01/03/2023 7.6  1.7 - 7.7 K/uL Final   Lymphocytes Relative 01/03/2023 26  % Final   Lymphs Abs 01/03/2023 3.2  0.7 - 4.0 K/uL Final   Monocytes Relative 01/03/2023 7  % Final   Monocytes Absolute 01/03/2023 0.8  0.1 - 1.0 K/uL Final   Eosinophils Relative 01/03/2023 3  % Final   Eosinophils Absolute 01/03/2023 0.4  0.0 - 0.5 K/uL Final   Basophils Relative 01/03/2023 1  % Final   Basophils Absolute 01/03/2023 0.1  0.0 - 0.1 K/uL Final   Immature Granulocytes 01/03/2023 0  % Final   Abs Immature Granulocytes 01/03/2023 0.03  0.00 - 0.07 K/uL Final   Performed at [facility], [address], [place], [place] [address]   Sodium 01/03/2023 137  135 - 145 mmol/L Final   Potassium 01/03/2023 4.1  3.5 - 5.1 mmol/L Final   Chloride 01/03/2023 101  98 - 111 mmol/L Final   CO2 01/03/2023 25  22 - 32 mmol/L Final   Glucose, Bld 01/03/2023 112 (H)  70 - 99 mg/dL Final   Glucose reference range applies only to samples taken after fasting for at least 8 hours.   BUN 01/03/2023 14  8 - 23 mg/dL Final   Creatinine, Ser 01/03/2023 0.73  0.44 - 1.00 mg/dL Final   Calcium 52/84/1324 9.1  8.9 - 10.3 mg/dL Final   Total Protein 40/04/2724 6.4 (L)  6.5 - 8.1 g/dL Final   Albumin 36/64/4034 3.5  3.5 - 5.0 g/dL Final   AST 74/25/9563 22  15 - 41 U/L Final   ALT 01/03/2023 23  0 - 44 U/L Final    Alkaline Phosphatase 01/03/2023 86  38 - 126 U/L Final   Total Bilirubin 01/03/2023 0.4  0.3 - 1.2 mg/dL Final   GFR, Estimated 01/03/2023 >60  >60 mL/min Final   Comment: (NOTE) Calculated using the CKD-EPI Creatinine Equation (2021)    Anion gap 01/03/2023 11  5 - 15 Final   Performed at [facility], [address], [place], [place] [address]   Kappa free light chain 01/03/2023 17.5  3.3 - 19.4 mg/L Final   Lambda free light chains 01/03/2023 294.8 (H)  5.7 - 26.3 mg/L Final   Kappa, lambda light chain ratio 01/03/2023 0.06 (L)  0.26 - 1.65 Final   Comment: (NOTE)  Performed At: [facility] [address] [place], [place] [id number] Jolene Schimke MD NW:[id number]    IgG (Immunoglobin G), Serum 01/03/2023 865  586 - 1,602 mg/dL Final   IgA 57/84/6962 125  64 - 422 mg/dL Final   IgM (Immunoglobulin M), Srm 01/03/2023 18 (L)  26 - 217 mg/dL Final   Result confirmed on concentration.   Total Protein ELP 01/03/2023 6.2  6.0 - 8.5 g/dL Corrected   Albumin SerPl Elph-Mcnc 01/03/2023 3.3  2.9 - 4.4 g/dL Corrected   Alpha 1 95/28/4132 0.2  0.0 - 0.4 g/dL Corrected   Alpha2 Glob SerPl Elph-Mcnc 01/03/2023 1.1 (H)  0.4 - 1.0 g/dL Corrected   B-Globulin SerPl Elph-Mcnc 01/03/2023 0.9  0.7 - 1.3 g/dL Corrected   Gamma Glob SerPl Elph-Mcnc 01/03/2023 0.7  0.4 - 1.8 g/dL Corrected   M Protein SerPl Elph-Mcnc 01/03/2023 0.5 (H)  Not Observed g/dL Corrected   Globulin, Total 01/03/2023 2.9  2.2 - 3.9 g/dL Corrected   Albumin/Glob SerPl 01/03/2023 1.2  0.7 - 1.7 Corrected   IFE 1 01/03/2023 Comment (A)   Corrected   Comment: (NOTE) Immunofixation shows IgG monoclonal protein with lambda light chain specificity.    Please Note 01/03/2023 Comment   Corrected   Comment: (NOTE) Protein electrophoresis scan will follow via computer, mail, or courier delivery. Performed At: [facility] [address] [place], [place] [id number] Jolene Schimke MD DG:[id number]     Ferritin 01/03/2023 164  11 - 307 ng/mL Final   Performed at [facility], [address], [place], [place] [address]    RADIOGRAPHIC STUDIES: I have personally reviewed the radiological images as listed and agree with the findings in the report  No results found.  ASSESSMENT/PLAN  82 y.o. female is here because of  anemia and granulocytosis.  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  Not taking oral iron  MGUS, IgG lambda  August 24 2022:  SPEP with IEP demonstrated  0.3 g/dL of an IgG lambda monoclonal protein IgG 674 IgA 131 IgM 22  September 05 2022:  LDH 161 beta-2 microglobulin 2.4 UPEP with IFE showed loss of 348 mg of protein per day of which 213 mg was lambda light chains  October 03 2022- Discussed consideration of evaluation for amyloid given renal insufficiency but patient opted for close follow up which is reasonable.   January 03 2023-  SPEP with IEP demonstrated 0.5 g/dL of an IgG lambda monoclonal protein.  Serum kappa 17.5 lambda 295 ratio 0.06  IgG 865 IgA 125 IgM 18.  No evidence of worsening renal function  May 09, 2023--continue surveillance for MGUS.    Granulocytosis:  August 24, 2022:  FISH for BCR able negative;  JAK2 with reflex panel negative.  Urinalysis notable for large number of leukocytes and nitrites  September 05, 2022: Results from previous visit indicate myeloproliferative disorder unlikely.  Most likely etiology is diabetes mellitus type 2  January 03 2024-   WBC elevated but ANC normal  May 09, 2023--WBC remains elevated with normal  ANC   Cancer Staging  No matching staging information was found for the patient.    No problem-specific Assessment & Plan notes found for this encounter.    No orders of the defined types were placed in this encounter.   30  minutes was spent in patient care.  This included time spent preparing to see the patient (e.g., review of tests), obtaining and/or reviewing separately obtained history, counseling and educating the patient/family/caregiver, ordering medications, tests, or procedures; documenting clinical information in the electronic or other health record, independently interpreting results and communicating results to the patient/family/caregiver as well as coordination of care.       All questions were answered. The patient knows to call the clinic with any problems, questions or concerns.  This note was electronically signed.    Loni Muse, MD  05/09/2023 11:18 AM

## 2023-05-10 LAB — KAPPA/LAMBDA LIGHT CHAINS
Kappa free light chain: 16.8 mg/L (ref 3.3–19.4)
Kappa, lambda light chain ratio: 0.07 — ABNORMAL LOW (ref 0.26–1.65)
Lambda free light chains: 242.5 mg/L — ABNORMAL HIGH (ref 5.7–26.3)

## 2023-05-16 LAB — MULTIPLE MYELOMA PANEL, SERUM
Albumin SerPl Elph-Mcnc: 3.2 g/dL (ref 2.9–4.4)
Albumin/Glob SerPl: 1.1 (ref 0.7–1.7)
Alpha 1: 0.3 g/dL (ref 0.0–0.4)
Alpha2 Glob SerPl Elph-Mcnc: 1.1 g/dL — ABNORMAL HIGH (ref 0.4–1.0)
B-Globulin SerPl Elph-Mcnc: 1 g/dL (ref 0.7–1.3)
Gamma Glob SerPl Elph-Mcnc: 0.8 g/dL (ref 0.4–1.8)
Globulin, Total: 3.1 g/dL (ref 2.2–3.9)
IgA: 127 mg/dL (ref 64–422)
IgG (Immunoglobin G), Serum: 820 mg/dL (ref 586–1602)
IgM (Immunoglobulin M), Srm: 19 mg/dL — ABNORMAL LOW (ref 26–217)
M Protein SerPl Elph-Mcnc: 0.6 g/dL — ABNORMAL HIGH
Total Protein ELP: 6.3 g/dL (ref 6.0–8.5)

## 2023-05-17 ENCOUNTER — Telehealth: Payer: Self-pay

## 2023-05-17 NOTE — Telephone Encounter (Signed)
Patient updated on lab results.

## 2023-05-17 NOTE — Telephone Encounter (Signed)
-----   Message from Loni Muse sent at 05/09/2023 11:34 AM EDT ----- Please contact patient by phone with lab results when they become available Thanks

## 2023-05-24 DIAGNOSIS — E1136 Type 2 diabetes mellitus with diabetic cataract: Secondary | ICD-10-CM | POA: Diagnosis not present

## 2023-05-24 DIAGNOSIS — E782 Mixed hyperlipidemia: Secondary | ICD-10-CM | POA: Diagnosis not present

## 2023-05-24 DIAGNOSIS — I10 Essential (primary) hypertension: Secondary | ICD-10-CM | POA: Diagnosis not present

## 2023-05-24 DIAGNOSIS — E538 Deficiency of other specified B group vitamins: Secondary | ICD-10-CM | POA: Diagnosis not present

## 2023-05-24 DIAGNOSIS — E1142 Type 2 diabetes mellitus with diabetic polyneuropathy: Secondary | ICD-10-CM | POA: Diagnosis not present

## 2023-05-24 DIAGNOSIS — Z6830 Body mass index (BMI) 30.0-30.9, adult: Secondary | ICD-10-CM | POA: Diagnosis not present

## 2023-05-24 DIAGNOSIS — J0100 Acute maxillary sinusitis, unspecified: Secondary | ICD-10-CM | POA: Diagnosis not present

## 2023-05-24 DIAGNOSIS — G629 Polyneuropathy, unspecified: Secondary | ICD-10-CM | POA: Diagnosis not present

## 2023-05-24 DIAGNOSIS — Z23 Encounter for immunization: Secondary | ICD-10-CM | POA: Diagnosis not present

## 2023-06-07 DIAGNOSIS — R300 Dysuria: Secondary | ICD-10-CM | POA: Diagnosis not present

## 2023-07-27 DIAGNOSIS — F331 Major depressive disorder, recurrent, moderate: Secondary | ICD-10-CM | POA: Diagnosis not present

## 2023-07-27 DIAGNOSIS — F411 Generalized anxiety disorder: Secondary | ICD-10-CM | POA: Diagnosis not present

## 2023-09-06 ENCOUNTER — Other Ambulatory Visit: Payer: Self-pay

## 2023-09-06 DIAGNOSIS — Z1231 Encounter for screening mammogram for malignant neoplasm of breast: Secondary | ICD-10-CM

## 2023-09-07 ENCOUNTER — Other Ambulatory Visit: Payer: Self-pay | Admitting: Oncology

## 2023-09-07 DIAGNOSIS — D500 Iron deficiency anemia secondary to blood loss (chronic): Secondary | ICD-10-CM

## 2023-09-07 NOTE — Progress Notes (Signed)
 [facility]  [address] [place],  [place]  [address] [phone]  Clinic Day:  09/08/2023  Referring physician: Sherial Dimes Key, *   HISTORY OF PRESENT ILLNESS:  The patient is a 83 y.o. female with anemia secondary to kidney disease and previous iron deficiency.  She last received IV iron in February 2024.  She also has MGUS.  She comes in today for routine follow-up.  Since her last visit, the patient has been doing fairly well.  She denies having increased fatigue or any overt forms of blood loss which concern her for progressive anemia.  PHYSICAL EXAM:  Blood pressure (!) 136/52, pulse 78, temperature 98.1 F (36.7 C), temperature source Oral, resp. rate 16, height 5' (1.524 m), weight 161 lb (73 kg), SpO2 97%. Wt Readings from Last 3 Encounters:  09/08/23 161 lb (73 kg)  05/09/23 157 lb 3.2 oz (71.3 kg)  01/03/23 159 lb 9.6 oz (72.4 kg)   Body mass index is 31.44 kg/m. Performance status (ECOG): 1 - Symptomatic but completely ambulatory Physical Exam Constitutional:      Appearance: Normal appearance. She is not ill-appearing.  HENT:     Mouth/Throat:     Mouth: Mucous membranes are moist.     Pharynx: Oropharynx is clear. No oropharyngeal exudate or posterior oropharyngeal erythema.  Cardiovascular:     Rate and Rhythm: Normal rate and regular rhythm.     Heart sounds: No murmur heard.    No friction rub. No gallop.  Pulmonary:     Effort: Pulmonary effort is normal. No respiratory distress.     Breath sounds: Normal breath sounds. No wheezing, rhonchi or rales.  Abdominal:     General: Bowel sounds are normal. There is no distension.     Palpations: Abdomen is soft. There is no mass.     Tenderness: There is no abdominal tenderness.  Musculoskeletal:        General: No swelling.     Right lower leg: No edema.     Left lower leg: No edema.  Lymphadenopathy:     Cervical: No cervical adenopathy.     Upper Body:      Right upper body: No supraclavicular or axillary adenopathy.     Left upper body: No supraclavicular or axillary adenopathy.     Lower Body: No right inguinal adenopathy. No left inguinal adenopathy.  Skin:    General: Skin is warm.     Coloration: Skin is not jaundiced.     Findings: No lesion or rash.  Neurological:     General: No focal deficit present.     Mental Status: She is alert and oriented to person, place, and time. Mental status is at baseline.  Psychiatric:        Mood and Affect: Mood normal.        Behavior: Behavior normal.        Thought Content: Thought content normal.   LABS:      Latest Ref Rng & Units 09/08/2023   11:07 AM 05/09/2023   11:47 AM 01/03/2023    4:25 PM  CBC  WBC 4.0 - 10.5 K/uL 12.5  10.7  12.0   Hemoglobin 12.0 - 15.0 g/dL 91.4  78.2  95.6   Hematocrit 36.0 - 46.0 % 40.1  43.0  44.4   Platelets 150 - 400 K/uL 295  323  301       Latest Ref Rng & Units 09/08/2023   11:07 AM 05/09/2023  11:47 AM 01/03/2023    4:25 PM  CMP  Glucose 70 - 99 mg/dL 161  096  045   BUN 8 - 23 mg/dL 13  16  14    Creatinine 0.44 - 1.00 mg/dL 4.09  8.11  9.14   Sodium 135 - 145 mmol/L 139  139  137   Potassium 3.5 - 5.1 mmol/L 3.8  4.5  4.1   Chloride 98 - 111 mmol/L 100  100  101   CO2 22 - 32 mmol/L 28  29  25    Calcium 8.9 - 10.3 mg/dL 9.7  9.7  9.1   Total Protein 6.5 - 8.1 g/dL 6.9  6.3  6.4   Total Bilirubin 0.0 - 1.2 mg/dL 0.3  0.5  0.4   Alkaline Phos 38 - 126 U/L 103  73  86   AST 15 - 41 U/L 20  21  22    ALT 0 - 44 U/L 12  19  23      Latest Reference Range & Units 09/08/23 11:07  Iron 28 - 170 ug/dL 70  UIBC ug/dL 782  TIBC 956 - 213 ug/dL 086  Saturation Ratios 10.4 - 31.8 % 22  Ferritin 11 - 307 ng/mL 137  Folate >5.9 ng/mL 24.0  Vitamin B12 180 - 914 pg/mL 219    Latest Reference Range & Units 09/08/23 11:07  Globulin, Total 2.2 - 3.9 g/dL 3.5 (C)  A/G Ratio 0.7 - 1.7  0.9 (C)  Alpha-1-Globulin 0.0 - 0.4 g/dL 0.3  [id number] 0.4 -  1.0 g/dL 1.1 (H)  Beta Globulin 0.7 - 1.3 g/dL 1.0  Gamma Globulin 0.4 - 1.8 g/dL 1.1  M-SPIKE, % Not Observed g/dL 0.8 (H)  SPE Interp.  Comment  (H): Data is abnormally high (C): Corrected  ASSESSMENT & PLAN:  Assessment/Plan:  An 83 y.o. female with anemia secondary to chronic renal insufficiency and previous iron deficiency.  When evaluating her labs today, I am pleased with her hemoglobin.  Furthermore her iron parameters remain ideal.  Clinically, she appears to be doing well.  I do feel comfortable seeing her back in 6 months for repeat clinical assessment.  The patient understands all the plans discussed today and is in agreement with them.    Hansen Carino Felicia Horde, MD

## 2023-09-08 ENCOUNTER — Telehealth: Payer: Self-pay | Admitting: Oncology

## 2023-09-08 ENCOUNTER — Inpatient Hospital Stay: Payer: Medicare HMO | Attending: Oncology

## 2023-09-08 ENCOUNTER — Ambulatory Visit: Payer: Medicare HMO | Admitting: Oncology

## 2023-09-08 ENCOUNTER — Inpatient Hospital Stay: Payer: Medicare HMO | Admitting: Oncology

## 2023-09-08 ENCOUNTER — Other Ambulatory Visit: Payer: Medicare HMO

## 2023-09-08 ENCOUNTER — Other Ambulatory Visit: Payer: Self-pay | Admitting: Oncology

## 2023-09-08 VITALS — BP 136/52 | HR 78 | Temp 98.1°F | Resp 16 | Ht 60.0 in | Wt 161.0 lb

## 2023-09-08 DIAGNOSIS — D631 Anemia in chronic kidney disease: Secondary | ICD-10-CM | POA: Diagnosis not present

## 2023-09-08 DIAGNOSIS — D500 Iron deficiency anemia secondary to blood loss (chronic): Secondary | ICD-10-CM

## 2023-09-08 DIAGNOSIS — N189 Chronic kidney disease, unspecified: Secondary | ICD-10-CM | POA: Diagnosis not present

## 2023-09-08 DIAGNOSIS — Z79899 Other long term (current) drug therapy: Secondary | ICD-10-CM | POA: Insufficient documentation

## 2023-09-08 DIAGNOSIS — D509 Iron deficiency anemia, unspecified: Secondary | ICD-10-CM | POA: Diagnosis not present

## 2023-09-08 DIAGNOSIS — D472 Monoclonal gammopathy: Secondary | ICD-10-CM | POA: Diagnosis not present

## 2023-09-08 LAB — CBC WITH DIFFERENTIAL (CANCER CENTER ONLY)
Abs Immature Granulocytes: 0.04 10*3/uL (ref 0.00–0.07)
Basophils Absolute: 0.1 10*3/uL (ref 0.0–0.1)
Basophils Relative: 1 %
Eosinophils Absolute: 1.1 10*3/uL — ABNORMAL HIGH (ref 0.0–0.5)
Eosinophils Relative: 9 %
HCT: 40.1 % (ref 36.0–46.0)
Hemoglobin: 13.3 g/dL (ref 12.0–15.0)
Immature Granulocytes: 0 %
Lymphocytes Relative: 29 %
Lymphs Abs: 3.6 10*3/uL (ref 0.7–4.0)
MCH: 28.6 pg (ref 26.0–34.0)
MCHC: 33.2 g/dL (ref 30.0–36.0)
MCV: 86.2 fL (ref 80.0–100.0)
Monocytes Absolute: 0.9 10*3/uL (ref 0.1–1.0)
Monocytes Relative: 7 %
Neutro Abs: 6.8 10*3/uL (ref 1.7–7.7)
Neutrophils Relative %: 54 %
Platelet Count: 295 10*3/uL (ref 150–400)
RBC: 4.65 MIL/uL (ref 3.87–5.11)
RDW: 15.1 % (ref 11.5–15.5)
WBC Count: 12.5 10*3/uL — ABNORMAL HIGH (ref 4.0–10.5)
nRBC: 0 % (ref 0.0–0.2)
nRBC: 0 /100{WBCs}

## 2023-09-08 LAB — CMP (CANCER CENTER ONLY)
ALT: 12 U/L (ref 0–44)
AST: 20 U/L (ref 15–41)
Albumin: 3.5 g/dL (ref 3.5–5.0)
Alkaline Phosphatase: 103 U/L (ref 38–126)
Anion gap: 10 (ref 5–15)
BUN: 13 mg/dL (ref 8–23)
CO2: 28 mmol/L (ref 22–32)
Calcium: 9.7 mg/dL (ref 8.9–10.3)
Chloride: 100 mmol/L (ref 98–111)
Creatinine: 0.84 mg/dL (ref 0.44–1.00)
GFR, Estimated: >60 mL/min (ref >60)
Glucose, Bld: 115 mg/dL — ABNORMAL HIGH (ref 70–99)
Potassium: 3.8 mmol/L (ref 3.5–5.1)
Sodium: 139 mmol/L (ref 135–145)
Total Bilirubin: 0.3 mg/dL (ref 0.0–1.2)
Total Protein: 6.9 g/dL (ref 6.5–8.1)

## 2023-09-08 LAB — IRON AND TIBC
Iron: 70 ug/dL (ref 28–170)
Saturation Ratios: 22 % (ref 10.4–31.8)
TIBC: 326 ug/dL (ref 250–450)
UIBC: 256 ug/dL

## 2023-09-08 LAB — FERRITIN: Ferritin: 137 ng/mL (ref 11–307)

## 2023-09-08 LAB — FOLATE: Folate: 24 ng/mL (ref >5.9)

## 2023-09-08 LAB — VITAMIN B12: Vitamin B-12: 219 pg/mL (ref 180–914)

## 2023-09-08 NOTE — Telephone Encounter (Signed)
 Patient has been scheduled for follow-up visit per 09/08/23 LOS.  Pt given an appt calendar with date and time.

## 2023-09-13 DIAGNOSIS — E782 Mixed hyperlipidemia: Secondary | ICD-10-CM | POA: Diagnosis not present

## 2023-09-13 DIAGNOSIS — K580 Irritable bowel syndrome with diarrhea: Secondary | ICD-10-CM | POA: Diagnosis not present

## 2023-09-13 DIAGNOSIS — E1136 Type 2 diabetes mellitus with diabetic cataract: Secondary | ICD-10-CM | POA: Diagnosis not present

## 2023-09-13 DIAGNOSIS — J208 Acute bronchitis due to other specified organisms: Secondary | ICD-10-CM | POA: Diagnosis not present

## 2023-09-13 DIAGNOSIS — E1142 Type 2 diabetes mellitus with diabetic polyneuropathy: Secondary | ICD-10-CM | POA: Diagnosis not present

## 2023-09-13 DIAGNOSIS — G629 Polyneuropathy, unspecified: Secondary | ICD-10-CM | POA: Diagnosis not present

## 2023-09-13 DIAGNOSIS — I10 Essential (primary) hypertension: Secondary | ICD-10-CM | POA: Diagnosis not present

## 2023-09-13 DIAGNOSIS — D72829 Elevated white blood cell count, unspecified: Secondary | ICD-10-CM | POA: Diagnosis not present

## 2023-09-13 DIAGNOSIS — Z6831 Body mass index (BMI) 31.0-31.9, adult: Secondary | ICD-10-CM | POA: Diagnosis not present

## 2023-09-13 DIAGNOSIS — F418 Other specified anxiety disorders: Secondary | ICD-10-CM | POA: Diagnosis not present

## 2023-09-19 LAB — PROTEIN ELECTROPHORESIS, SERUM
A/G Ratio: 0.9 (ref 0.7–1.7)
Albumin ELP: 3 g/dL (ref 2.9–4.4)
Alpha-1-Globulin: 0.3 g/dL (ref 0.0–0.4)
Alpha-2-Globulin: 1.1 g/dL — ABNORMAL HIGH (ref 0.4–1.0)
Beta Globulin: 1 g/dL (ref 0.7–1.3)
Gamma Globulin: 1.1 g/dL (ref 0.4–1.8)
Globulin, Total: 3.5 g/dL (ref 2.2–3.9)
M-Spike, %: 0.8 g/dL — ABNORMAL HIGH
Total Protein ELP: 6.5 g/dL (ref 6.0–8.5)

## 2023-09-20 ENCOUNTER — Ambulatory Visit
Admission: RE | Admit: 2023-09-20 | Discharge: 2023-09-20 | Disposition: A | Discharge status: Home / Self Care | Payer: Medicare HMO | Source: Ambulatory Visit | Attending: Adult Health | Admitting: Adult Health

## 2023-09-20 DIAGNOSIS — Z1231 Encounter for screening mammogram for malignant neoplasm of breast: Secondary | ICD-10-CM

## 2023-10-12 DIAGNOSIS — F411 Generalized anxiety disorder: Secondary | ICD-10-CM | POA: Diagnosis not present

## 2023-10-12 DIAGNOSIS — F331 Major depressive disorder, recurrent, moderate: Secondary | ICD-10-CM | POA: Diagnosis not present

## 2023-10-25 ENCOUNTER — Encounter: Payer: Self-pay | Admitting: Hematology and Oncology

## 2023-12-13 DIAGNOSIS — M810 Age-related osteoporosis without current pathological fracture: Secondary | ICD-10-CM | POA: Diagnosis not present

## 2023-12-13 DIAGNOSIS — Z6831 Body mass index (BMI) 31.0-31.9, adult: Secondary | ICD-10-CM | POA: Diagnosis not present

## 2023-12-13 DIAGNOSIS — D72829 Elevated white blood cell count, unspecified: Secondary | ICD-10-CM | POA: Diagnosis not present

## 2023-12-13 DIAGNOSIS — D519 Vitamin B12 deficiency anemia, unspecified: Secondary | ICD-10-CM | POA: Diagnosis not present

## 2023-12-13 DIAGNOSIS — E1136 Type 2 diabetes mellitus with diabetic cataract: Secondary | ICD-10-CM | POA: Diagnosis not present

## 2023-12-13 DIAGNOSIS — G629 Polyneuropathy, unspecified: Secondary | ICD-10-CM | POA: Diagnosis not present

## 2023-12-13 DIAGNOSIS — F418 Other specified anxiety disorders: Secondary | ICD-10-CM | POA: Diagnosis not present

## 2023-12-13 DIAGNOSIS — E1142 Type 2 diabetes mellitus with diabetic polyneuropathy: Secondary | ICD-10-CM | POA: Diagnosis not present

## 2023-12-13 DIAGNOSIS — E782 Mixed hyperlipidemia: Secondary | ICD-10-CM | POA: Diagnosis not present

## 2023-12-13 DIAGNOSIS — I10 Essential (primary) hypertension: Secondary | ICD-10-CM | POA: Diagnosis not present

## 2023-12-13 DIAGNOSIS — L84 Corns and callosities: Secondary | ICD-10-CM | POA: Diagnosis not present

## 2024-01-10 DIAGNOSIS — F331 Major depressive disorder, recurrent, moderate: Secondary | ICD-10-CM | POA: Diagnosis not present

## 2024-01-10 DIAGNOSIS — F411 Generalized anxiety disorder: Secondary | ICD-10-CM | POA: Diagnosis not present

## 2024-02-13 DIAGNOSIS — G629 Polyneuropathy, unspecified: Secondary | ICD-10-CM | POA: Diagnosis not present

## 2024-02-29 ENCOUNTER — Inpatient Hospital Stay: Payer: Medicare HMO | Attending: Oncology

## 2024-02-29 DIAGNOSIS — N189 Chronic kidney disease, unspecified: Secondary | ICD-10-CM | POA: Diagnosis not present

## 2024-02-29 DIAGNOSIS — D631 Anemia in chronic kidney disease: Secondary | ICD-10-CM | POA: Diagnosis not present

## 2024-02-29 DIAGNOSIS — D472 Monoclonal gammopathy: Secondary | ICD-10-CM | POA: Diagnosis not present

## 2024-02-29 DIAGNOSIS — E611 Iron deficiency: Secondary | ICD-10-CM | POA: Diagnosis not present

## 2024-02-29 DIAGNOSIS — D500 Iron deficiency anemia secondary to blood loss (chronic): Secondary | ICD-10-CM

## 2024-02-29 LAB — CMP (CANCER CENTER ONLY)
ALT: 19 U/L (ref 0–44)
AST: 21 U/L (ref 15–41)
Albumin: 3.5 g/dL (ref 3.5–5.0)
Alkaline Phosphatase: 84 U/L (ref 38–126)
Anion gap: 12 (ref 5–15)
BUN: 14 mg/dL (ref 8–23)
CO2: 27 mmol/L (ref 22–32)
Calcium: 9.6 mg/dL (ref 8.9–10.3)
Chloride: 100 mmol/L (ref 98–111)
Creatinine: 0.71 mg/dL (ref 0.44–1.00)
GFR, Estimated: >60 mL/min (ref >60)
Glucose, Bld: 104 mg/dL — ABNORMAL HIGH (ref 70–99)
Potassium: 3.9 mmol/L (ref 3.5–5.1)
Sodium: 139 mmol/L (ref 135–145)
Total Bilirubin: 0.3 mg/dL (ref 0.0–1.2)
Total Protein: 7.5 g/dL (ref 6.5–8.1)

## 2024-02-29 LAB — IRON AND TIBC
Iron: 57 ug/dL (ref 28–170)
Saturation Ratios: 17 % (ref 10.4–31.8)
TIBC: 342 ug/dL (ref 250–450)
UIBC: 285 ug/dL

## 2024-02-29 LAB — CBC WITH DIFFERENTIAL (CANCER CENTER ONLY)
Abs Immature Granulocytes: 0.06 K/uL (ref 0.00–0.07)
Basophils Absolute: 0.1 K/uL (ref 0.0–0.1)
Basophils Relative: 1 %
Eosinophils Absolute: 0.5 K/uL (ref 0.0–0.5)
Eosinophils Relative: 4 %
HCT: 41.2 % (ref 36.0–46.0)
Hemoglobin: 13.3 g/dL (ref 12.0–15.0)
Immature Granulocytes: 1 %
Lymphocytes Relative: 22 %
Lymphs Abs: 2.8 K/uL (ref 0.7–4.0)
MCH: 28.2 pg (ref 26.0–34.0)
MCHC: 32.3 g/dL (ref 30.0–36.0)
MCV: 87.3 fL (ref 80.0–100.0)
Monocytes Absolute: 1 K/uL (ref 0.1–1.0)
Monocytes Relative: 8 %
Neutro Abs: 8.5 K/uL — ABNORMAL HIGH (ref 1.7–7.7)
Neutrophils Relative %: 64 %
Platelet Count: 332 K/uL (ref 150–400)
RBC: 4.72 MIL/uL (ref 3.87–5.11)
RDW: 14.9 % (ref 11.5–15.5)
WBC Count: 12.9 K/uL — ABNORMAL HIGH (ref 4.0–10.5)
nRBC: 0 % (ref 0.0–0.2)

## 2024-02-29 LAB — FERRITIN: Ferritin: 99 ng/mL (ref 11–307)

## 2024-03-01 LAB — PROTEIN ELECTROPHORESIS, SERUM
A/G Ratio: 0.7 (ref 0.7–1.7)
Albumin ELP: 2.8 g/dL — ABNORMAL LOW (ref 2.9–4.4)
Alpha-1-Globulin: 0.3 g/dL (ref 0.0–0.4)
Alpha-2-Globulin: 1.1 g/dL — ABNORMAL HIGH (ref 0.4–1.0)
Beta Globulin: 1 g/dL (ref 0.7–1.3)
Gamma Globulin: 1.5 g/dL (ref 0.4–1.8)
Globulin, Total: 3.9 g/dL (ref 2.2–3.9)
M-Spike, %: 1.2 g/dL — ABNORMAL HIGH
Total Protein ELP: 6.7 g/dL (ref 6.0–8.5)

## 2024-03-06 NOTE — Progress Notes (Unsigned)
 [facility]  [address] [place],  [place]  [address] [phone]  Clinic Day:  09/08/2023  Referring physician: Cristela Nest Key, *   HISTORY OF PRESENT ILLNESS:  The patient is a 83 y.o. 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  She last received IV iron  in February 2024.  She also has MGUS.  She comes in today for routine follow-up.  Since her last visit, the patient has been doing fairly well.  She denies having increased fatigue or any overt forms of blood loss which concern her for progressive anemia.  PHYSICAL EXAM:  There were no vitals taken for this visit. Wt Readings from Last 3 Encounters:  09/08/23 161 lb (73 kg)  05/09/23 157 lb 3.2 oz (71.3 kg)  01/03/23 159 lb 9.6 oz (72.4 kg)   There is no height or weight on file to calculate BMI. Performance status (ECOG): 1 - Symptomatic but completely ambulatory Physical Exam Constitutional:      Appearance: Normal appearance. She is not ill-appearing.  HENT:     Mouth/Throat:     Mouth: Mucous membranes are moist.     Pharynx: Oropharynx is clear. No oropharyngeal exudate or posterior oropharyngeal erythema.  Cardiovascular:     Rate and Rhythm: Normal rate and regular rhythm.     Heart sounds: No murmur heard.    No friction rub. No gallop.  Pulmonary:     Effort: Pulmonary effort is normal. No respiratory distress.     Breath sounds: Normal breath sounds. No wheezing, rhonchi or rales.  Abdominal:     General: Bowel sounds are normal. There is no distension.     Palpations: Abdomen is soft. There is no mass.     Tenderness: There is no abdominal tenderness.  Musculoskeletal:        General: No swelling.     Right lower leg: No edema.     Left lower leg: No edema.  Lymphadenopathy:     Cervical: No cervical adenopathy.     Upper Body:     Right upper body: No supraclavicular or axillary adenopathy.     Left upper body: No  supraclavicular or axillary adenopathy.     Lower Body: No right inguinal adenopathy. No left inguinal adenopathy.  Skin:    General: Skin is warm.     Coloration: Skin is not jaundiced.     Findings: No lesion or rash.  Neurological:     General: No focal deficit present.     Mental Status: She is alert and oriented to person, place, and time. Mental status is at baseline.  Psychiatric:        Mood and Affect: Mood normal.        Behavior: Behavior normal.        Thought Content: Thought content normal.    LABS:      Latest Ref Rng & Units 02/29/2024   11:12 AM 09/08/2023   11:07 AM 05/09/2023   11:47 AM  CBC  WBC 4.0 - 10.5 K/uL 12.9  12.5  10.7   Hemoglobin 12.0 - 15.0 g/dL 86.6  86.6  86.4   Hematocrit 36.0 - 46.0 % 41.2  40.1  43.0   Platelets 150 - 400 K/uL 332  295  323       Latest Ref Rng & Units 02/29/2024   11:12 AM 09/08/2023   11:07 AM 05/09/2023   11:47 AM  CMP  Glucose 70 -  99 mg/dL 895  884  859   BUN 8 - 23 mg/dL 14  13  16    Creatinine 0.44 - 1.00 mg/dL 9.28  9.15  9.37   Sodium 135 - 145 mmol/L 139  139  139   Potassium 3.5 - 5.1 mmol/L 3.9  3.8  4.5   Chloride 98 - 111 mmol/L 100  100  100   CO2 22 - 32 mmol/L 27  28  29    Calcium 8.9 - 10.3 mg/dL 9.6  9.7  9.7   Total Protein 6.5 - 8.1 g/dL 7.5  6.9  6.3   Total Bilirubin 0.0 - 1.2 mg/dL 0.3  0.3  0.5   Alkaline Phos 38 - 126 U/L 84  103  73   AST 15 - 41 U/L 21  20  21    ALT 0 - 44 U/L 19  12  19      Latest Reference Range & Units 02/29/24 11:11  Iron  28 - 170 ug/dL 57  UIBC ug/dL 714  TIBC 749 - 549 ug/dL 657  Saturation Ratios 10.4 - 31.8 % 17  Ferritin 11 - 307 ng/mL 99    Latest Reference Range & Units 01/03/23 16:26 05/09/23 11:47 09/08/23 11:07 02/29/24 11:11  M Protein SerPl Elph-Mcnc Not Observed g/dL 0.5 (H) (C) 0.6 (H) (C)    M-SPIKE, % Not Observed g/dL   0.8 (H) 1.2 (H)  (H): Data is abnormally high (C): Corrected   ASSESSMENT & PLAN:  Assessment/Plan:  An 83 y.o. female  with anemia secondary to chronic renal insufficiency and previous iron  deficiency.  When evaluating her labs today, I am pleased with her hemoglobin.  Furthermore her iron  parameters remain ideal.  Clinically, she appears to be doing well.  I do feel comfortable seeing her back in 6 months for repeat clinical assessment.  The patient understands all the plans discussed today and is in agreement with them.    Emmons Toth DELENA Kerns, MD

## 2024-03-07 ENCOUNTER — Other Ambulatory Visit: Payer: Self-pay

## 2024-03-07 ENCOUNTER — Encounter: Payer: Self-pay | Admitting: Oncology

## 2024-03-07 ENCOUNTER — Inpatient Hospital Stay: Payer: Medicare HMO | Admitting: Oncology

## 2024-03-07 ENCOUNTER — Telehealth: Payer: Self-pay | Admitting: Oncology

## 2024-03-07 ENCOUNTER — Other Ambulatory Visit: Payer: Self-pay | Admitting: Oncology

## 2024-03-07 VITALS — BP 123/39 | HR 82 | Temp 98.1°F | Resp 20 | Ht 60.0 in | Wt 165.0 lb

## 2024-03-07 DIAGNOSIS — D472 Monoclonal gammopathy: Secondary | ICD-10-CM

## 2024-03-07 DIAGNOSIS — E611 Iron deficiency: Secondary | ICD-10-CM | POA: Diagnosis not present

## 2024-03-07 DIAGNOSIS — D631 Anemia in chronic kidney disease: Secondary | ICD-10-CM | POA: Diagnosis not present

## 2024-03-07 DIAGNOSIS — N189 Chronic kidney disease, unspecified: Secondary | ICD-10-CM | POA: Diagnosis not present

## 2024-03-07 NOTE — Telephone Encounter (Signed)
 Patient has been scheduled for follow-up visit per 03/07/24 LOS.  Pt given an appt calendar with date and time.

## 2024-03-13 DIAGNOSIS — E1142 Type 2 diabetes mellitus with diabetic polyneuropathy: Secondary | ICD-10-CM | POA: Diagnosis not present

## 2024-03-13 DIAGNOSIS — D72829 Elevated white blood cell count, unspecified: Secondary | ICD-10-CM | POA: Diagnosis not present

## 2024-03-13 DIAGNOSIS — G629 Polyneuropathy, unspecified: Secondary | ICD-10-CM | POA: Diagnosis not present

## 2024-03-13 DIAGNOSIS — F418 Other specified anxiety disorders: Secondary | ICD-10-CM | POA: Diagnosis not present

## 2024-03-13 DIAGNOSIS — Z6831 Body mass index (BMI) 31.0-31.9, adult: Secondary | ICD-10-CM | POA: Diagnosis not present

## 2024-03-13 DIAGNOSIS — E782 Mixed hyperlipidemia: Secondary | ICD-10-CM | POA: Diagnosis not present

## 2024-03-13 DIAGNOSIS — E1136 Type 2 diabetes mellitus with diabetic cataract: Secondary | ICD-10-CM | POA: Diagnosis not present

## 2024-03-13 DIAGNOSIS — M810 Age-related osteoporosis without current pathological fracture: Secondary | ICD-10-CM | POA: Diagnosis not present

## 2024-03-13 DIAGNOSIS — J0100 Acute maxillary sinusitis, unspecified: Secondary | ICD-10-CM | POA: Diagnosis not present

## 2024-03-13 DIAGNOSIS — I10 Essential (primary) hypertension: Secondary | ICD-10-CM | POA: Diagnosis not present

## 2024-05-02 DIAGNOSIS — Z0100 Encounter for examination of eyes and vision without abnormal findings: Secondary | ICD-10-CM | POA: Diagnosis not present

## 2024-05-21 DIAGNOSIS — R051 Acute cough: Secondary | ICD-10-CM | POA: Diagnosis not present

## 2024-05-21 DIAGNOSIS — R0981 Nasal congestion: Secondary | ICD-10-CM | POA: Diagnosis not present

## 2024-05-21 DIAGNOSIS — R0600 Dyspnea, unspecified: Secondary | ICD-10-CM | POA: Diagnosis not present

## 2024-05-28 DIAGNOSIS — R0600 Dyspnea, unspecified: Secondary | ICD-10-CM | POA: Diagnosis not present

## 2024-05-28 DIAGNOSIS — R051 Acute cough: Secondary | ICD-10-CM | POA: Diagnosis not present

## 2024-05-28 DIAGNOSIS — R0982 Postnasal drip: Secondary | ICD-10-CM | POA: Diagnosis not present

## 2024-06-05 DIAGNOSIS — J208 Acute bronchitis due to other specified organisms: Secondary | ICD-10-CM | POA: Diagnosis not present

## 2024-06-05 DIAGNOSIS — Z6831 Body mass index (BMI) 31.0-31.9, adult: Secondary | ICD-10-CM | POA: Diagnosis not present

## 2024-06-05 DIAGNOSIS — E1142 Type 2 diabetes mellitus with diabetic polyneuropathy: Secondary | ICD-10-CM | POA: Diagnosis not present

## 2024-06-12 DIAGNOSIS — Z0000 Encounter for general adult medical examination without abnormal findings: Secondary | ICD-10-CM | POA: Diagnosis not present

## 2024-06-12 DIAGNOSIS — E1136 Type 2 diabetes mellitus with diabetic cataract: Secondary | ICD-10-CM | POA: Diagnosis not present

## 2024-06-12 DIAGNOSIS — E782 Mixed hyperlipidemia: Secondary | ICD-10-CM | POA: Diagnosis not present

## 2024-06-12 DIAGNOSIS — D72829 Elevated white blood cell count, unspecified: Secondary | ICD-10-CM | POA: Diagnosis not present

## 2024-06-12 DIAGNOSIS — G629 Polyneuropathy, unspecified: Secondary | ICD-10-CM | POA: Diagnosis not present

## 2024-06-12 DIAGNOSIS — Z6831 Body mass index (BMI) 31.0-31.9, adult: Secondary | ICD-10-CM | POA: Diagnosis not present

## 2024-06-12 DIAGNOSIS — I10 Essential (primary) hypertension: Secondary | ICD-10-CM | POA: Diagnosis not present

## 2024-06-12 DIAGNOSIS — F418 Other specified anxiety disorders: Secondary | ICD-10-CM | POA: Diagnosis not present

## 2024-06-12 DIAGNOSIS — E1142 Type 2 diabetes mellitus with diabetic polyneuropathy: Secondary | ICD-10-CM | POA: Diagnosis not present

## 2024-06-12 DIAGNOSIS — Z9181 History of falling: Secondary | ICD-10-CM | POA: Diagnosis not present

## 2024-08-30 ENCOUNTER — Other Ambulatory Visit

## 2024-09-06 ENCOUNTER — Ambulatory Visit: Admitting: Oncology
